# Patient Record
Sex: Male | Born: 1975 | Race: Black or African American | Hispanic: No | Marital: Married | Smoking: Current every day smoker
Health system: Southern US, Community
[De-identification: ages and names within clinical notes are randomized; demographics above are authoritative.]

---

## 2019-01-11 ENCOUNTER — Other Ambulatory Visit: Payer: Self-pay

## 2019-01-11 ENCOUNTER — Inpatient Hospital Stay (HOSPITAL_COMMUNITY)
Admission: EM | Admit: 2019-01-11 | Discharge: 2019-01-14 | DRG: 871 | Disposition: A | Discharge status: Home / Self Care | Payer: Self-pay | Attending: Internal Medicine | Admitting: Internal Medicine

## 2019-01-11 ENCOUNTER — Encounter (HOSPITAL_COMMUNITY): Payer: Self-pay

## 2019-01-11 ENCOUNTER — Emergency Department (HOSPITAL_COMMUNITY): Payer: Self-pay

## 2019-01-11 DIAGNOSIS — Z720 Tobacco use: Secondary | ICD-10-CM

## 2019-01-11 DIAGNOSIS — B9789 Other viral agents as the cause of diseases classified elsewhere: Secondary | ICD-10-CM | POA: Diagnosis present

## 2019-01-11 DIAGNOSIS — J189 Pneumonia, unspecified organism: Secondary | ICD-10-CM | POA: Diagnosis present

## 2019-01-11 DIAGNOSIS — J1289 Other viral pneumonia: Secondary | ICD-10-CM | POA: Diagnosis present

## 2019-01-11 DIAGNOSIS — K625 Hemorrhage of anus and rectum: Secondary | ICD-10-CM | POA: Diagnosis present

## 2019-01-11 DIAGNOSIS — R109 Unspecified abdominal pain: Secondary | ICD-10-CM

## 2019-01-11 DIAGNOSIS — A419 Sepsis, unspecified organism: Principal | ICD-10-CM | POA: Diagnosis present

## 2019-01-11 DIAGNOSIS — Z20828 Contact with and (suspected) exposure to other viral communicable diseases: Secondary | ICD-10-CM | POA: Diagnosis present

## 2019-01-11 DIAGNOSIS — R55 Syncope and collapse: Secondary | ICD-10-CM | POA: Diagnosis present

## 2019-01-11 DIAGNOSIS — Z23 Encounter for immunization: Secondary | ICD-10-CM

## 2019-01-11 DIAGNOSIS — J15212 Pneumonia due to Methicillin resistant Staphylococcus aureus: Secondary | ICD-10-CM | POA: Diagnosis present

## 2019-01-11 LAB — LACTIC ACID, PLASMA
Lactic Acid, Venous: 1.5 mmol/L (ref 0.5–1.9)
Lactic Acid, Venous: 0.9 mmol/L (ref 0.5–1.9)

## 2019-01-11 LAB — CBC
HCT: 37.5 % — ABNORMAL LOW (ref 39.0–52.0)
Hemoglobin: 12.2 g/dL — ABNORMAL LOW (ref 13.0–17.0)
MCH: 32.8 pg (ref 26.0–34.0)
MCHC: 32.5 g/dL (ref 30.0–36.0)
MCV: 100.8 fL — ABNORMAL HIGH (ref 80.0–100.0)
Platelets: 250 10*3/uL (ref 150–400)
RBC: 3.72 MIL/uL — ABNORMAL LOW (ref 4.22–5.81)
RDW: 11.6 % (ref 11.5–15.5)
WBC: 14.4 10*3/uL — ABNORMAL HIGH (ref 4.0–10.5)
nRBC: 0 % (ref 0.0–0.2)

## 2019-01-11 LAB — POC OCCULT BLOOD, ED: Fecal Occult Bld: NEGATIVE

## 2019-01-11 LAB — BASIC METABOLIC PANEL
Anion gap: 11 (ref 5–15)
BUN: 12 mg/dL (ref 6–20)
CO2: 24 mmol/L (ref 22–32)
Calcium: 9.1 mg/dL (ref 8.9–10.3)
Chloride: 103 mmol/L (ref 98–111)
Creatinine, Ser: 1.1 mg/dL (ref 0.61–1.24)
GFR calc Af Amer: >60 mL/min (ref >60)
GFR calc non Af Amer: >60 mL/min (ref >60)
Glucose, Bld: 99 mg/dL (ref 70–99)
Potassium: 3.8 mmol/L (ref 3.5–5.1)
Sodium: 138 mmol/L (ref 135–145)

## 2019-01-11 LAB — URINALYSIS, ROUTINE W REFLEX MICROSCOPIC
Bacteria, UA: NONE SEEN
Bilirubin Urine: NEGATIVE
Glucose, UA: NEGATIVE mg/dL
Ketones, ur: 20 mg/dL — AB
Leukocytes,Ua: NEGATIVE
Nitrite: NEGATIVE
Protein, ur: NEGATIVE mg/dL
Specific Gravity, Urine: 1.029 (ref 1.005–1.030)
pH: 5 (ref 5.0–8.0)

## 2019-01-11 LAB — TYPE AND SCREEN
ABO/RH(D): O POS
Antibody Screen: NEGATIVE

## 2019-01-11 LAB — HEPATIC FUNCTION PANEL
ALT: 19 U/L (ref 0–44)
AST: 20 U/L (ref 15–41)
Albumin: 4.2 g/dL (ref 3.5–5.0)
Alkaline Phosphatase: 48 U/L (ref 38–126)
Bilirubin, Direct: 0.1 mg/dL (ref 0.0–0.2)
Indirect Bilirubin: 1.3 mg/dL — ABNORMAL HIGH (ref 0.3–0.9)
Total Bilirubin: 1.4 mg/dL — ABNORMAL HIGH (ref 0.3–1.2)
Total Protein: 7.2 g/dL (ref 6.5–8.1)

## 2019-01-11 LAB — PROTIME-INR
INR: 1.2 (ref 0.8–1.2)
Prothrombin Time: 14.9 seconds (ref 11.4–15.2)

## 2019-01-11 LAB — LIPASE, BLOOD: Lipase: 17 U/L (ref 11–51)

## 2019-01-11 LAB — SARS CORONAVIRUS 2 BY RT PCR (HOSPITAL ORDER, PERFORMED IN ~~LOC~~ HOSPITAL LAB): SARS Coronavirus 2: NEGATIVE

## 2019-01-11 LAB — APTT: aPTT: 33 seconds (ref 24–36)

## 2019-01-11 MED ORDER — MORPHINE SULFATE (PF) 4 MG/ML IV SOLN
4.0000 mg | Freq: Once | INTRAVENOUS | Status: AC
  Administered 2019-01-11: 21:00:00 4 mg via INTRAVENOUS
  Filled 2019-01-11: qty 1

## 2019-01-11 MED ORDER — SODIUM CHLORIDE 0.9 % IV BOLUS (SEPSIS)
1000.0000 mL | Freq: Once | INTRAVENOUS | Status: AC
  Administered 2019-01-11: 21:00:00 1000 mL via INTRAVENOUS

## 2019-01-11 MED ORDER — SODIUM CHLORIDE 0.9 % IV SOLN
500.0000 mg | Freq: Once | INTRAVENOUS | Status: DC
  Filled 2019-01-11: qty 500

## 2019-01-11 MED ORDER — VANCOMYCIN HCL IN DEXTROSE 750-5 MG/150ML-% IV SOLN
750.0000 mg | Freq: Three times a day (TID) | INTRAVENOUS | Status: DC
  Administered 2019-01-12 – 2019-01-14 (×8): 750 mg via INTRAVENOUS
  Filled 2019-01-11 (×11): qty 150

## 2019-01-11 MED ORDER — SODIUM CHLORIDE 0.9 % IV SOLN
2.0000 g | Freq: Once | INTRAVENOUS | Status: AC
  Administered 2019-01-11: 2 g via INTRAVENOUS
  Filled 2019-01-11: qty 20

## 2019-01-11 MED ORDER — SODIUM CHLORIDE 0.9 % IV SOLN
2.0000 g | Freq: Three times a day (TID) | INTRAVENOUS | Status: DC
  Administered 2019-01-12 – 2019-01-13 (×6): 2 g via INTRAVENOUS
  Filled 2019-01-11 (×8): qty 2

## 2019-01-11 MED ORDER — IOHEXOL 350 MG/ML SOLN
100.0000 mL | Freq: Once | INTRAVENOUS | Status: AC | PRN
  Administered 2019-01-11: 21:00:00 100 mL via INTRAVENOUS

## 2019-01-11 MED ORDER — SODIUM CHLORIDE 0.9% FLUSH
3.0000 mL | Freq: Once | INTRAVENOUS | Status: AC
  Administered 2019-01-11: 20:00:00 3 mL via INTRAVENOUS

## 2019-01-11 MED ORDER — ONDANSETRON HCL 4 MG/2ML IJ SOLN
4.0000 mg | Freq: Once | INTRAMUSCULAR | Status: AC
  Administered 2019-01-11: 21:00:00 4 mg via INTRAVENOUS
  Filled 2019-01-11: qty 2

## 2019-01-11 MED ORDER — SODIUM CHLORIDE 0.9 % IV SOLN
100.0000 mg | Freq: Once | INTRAVENOUS | Status: AC
  Administered 2019-01-11: 100 mg via INTRAVENOUS
  Filled 2019-01-11: qty 100

## 2019-01-11 MED ORDER — METRONIDAZOLE IN NACL 5-0.79 MG/ML-% IV SOLN
500.0000 mg | Freq: Three times a day (TID) | INTRAVENOUS | Status: DC
  Administered 2019-01-12 – 2019-01-13 (×5): 500 mg via INTRAVENOUS
  Filled 2019-01-11 (×5): qty 100

## 2019-01-11 MED ORDER — SODIUM CHLORIDE 0.9 % IV BOLUS
1000.0000 mL | Freq: Once | INTRAVENOUS | Status: AC
  Administered 2019-01-11: 20:00:00 1000 mL via INTRAVENOUS

## 2019-01-11 MED ORDER — METRONIDAZOLE IN NACL 5-0.79 MG/ML-% IV SOLN
500.0000 mg | Freq: Once | INTRAVENOUS | Status: AC
  Administered 2019-01-11: 22:00:00 500 mg via INTRAVENOUS
  Filled 2019-01-11: qty 100

## 2019-01-11 MED ORDER — SODIUM CHLORIDE 0.9 % IV BOLUS (SEPSIS)
500.0000 mL | Freq: Once | INTRAVENOUS | Status: AC
  Administered 2019-01-11: 22:00:00 500 mL via INTRAVENOUS

## 2019-01-11 MED ORDER — ACETAMINOPHEN 500 MG PO TABS
1000.0000 mg | ORAL_TABLET | Freq: Once | ORAL | Status: AC
  Administered 2019-01-11: 21:00:00 1000 mg via ORAL
  Filled 2019-01-11: qty 2

## 2019-01-11 NOTE — Progress Notes (Signed)
Pharmacy Antibiotic Note  Antonio Rowland is a 43 y.o. male admitted on 01/11/2019 with sepsis  Plan: Vanc 750 mg q8h - AUC 511 Cefepime 2 g q8h Metronidazole 500 mg q8h Monitor renal fx cx vanc lvls prn  Height: 5\' 10"  (177.8 cm) Weight: 170 lb (77.1 kg) IBW/kg (Calculated) : 73  Temp (24hrs), Avg:101 F (38.3 C), Min:100.9 F (38.3 C), Max:101 F (38.3 C)  Recent Labs  Lab 01/11/19 1926 01/11/19 2020  WBC 14.4*  --   CREATININE 1.10  --   LATICACIDVEN  --  1.5    Estimated Creatinine Clearance: 90.3 mL/min (by C-G formula based on SCr of 1.1 mg/dL).    Allergies  Allergen Reactions  . Azithromycin     hives  . Norco [Hydrocodone-Acetaminophen]     Weakness  . Ritalin [Methylphenidate]     swelling   Levester Fresh, PharmD, BCPS, BCCCP Clinical Pharmacist [phone]  Please check AMION for all [place] numbers  01/11/2019 11:13 PM

## 2019-01-11 NOTE — H&P (Signed)
Antonio Rowland QIO:[id number] DOB: 30-Dec-1975 DOA: 01/11/2019     PCP:at [facility]:  NONE    Patient arrived to ER on 01/11/19 at [place]  Patient coming from: home Lives   With family    Chief Complaint:   Chief Complaint  Patient presents with   Loss of Consciousness    HPI: Antonio Rowland is a 43 y.o. male with no significant medical history      Presented with bloody bowel movement today an episode of vomiting followed by syncopal episode has been having generalized abdominal pain. No preceding chest pain No prior hx of the same He was unsure he had a fever  He does not drink,  Recently started to smoke   Infectious risk factors:  Reports  fever,  abdominal pain,  Body aches, severe fatigue sick contacts, known COVID 19 exposure, Travel to high risk area, Exposure to travelers from high risk area   In  ER RAPID COVID TEST NEGATIVE   Lab Results  Component Value Date   [place] 01/11/2019        While in ER: Noted to be tachycardic up to 103 febrile 100.9 elevated white blood cell count to 14.4 hemoglobin 12.2 Started on IV fluids and given Rocephin and Flagyl for intra-abdominal causes of sepsis given tachycardia white blood cell count elevation  No melena bright red blood per rectum noted Lactic acid 1.5 hemoglobin stable  The following Work up has been ordered so far:  Orders Placed This Encounter  Procedures   Culture, blood (routine x 2)   SARS Coronavirus 2 (Hospital order, Performed in [facility] hospital lab) Nasopharyngeal Nasopharyngeal Swab   DG Chest Port 1 View   DG Abd Portable 1 View   CT Angio Abd/Pel W and/or Wo Contrast   Basic metabolic panel   CBC   Lipase, blood   Urinalysis, Routine w reflex microscopic   Lactic acid, plasma   Hepatic function panel   Protime-INR   APTT   Diet NPO time specified   Cardiac monitoring   Saline Lock IV, Maintain IV access   Cardiac  monitoring   Refer to Sidebar Report: Sepsis Sidebar ED/IP   Document vital signs within 1-hour of fluid bolus completion and notify provider of bolus completion   Document Actual / Estimated Weight   Insert peripheral IV x 2   Initiate Carrier Fluid Protocol   Initiate Code Sepsis ([facility] [phone]) Reason for Consult? tracking   pharmacy consult   Consult to general surgery  ALL PATIENTS BEING ADMITTED/HAVING PROCEDURES NEED COVID-19 SCREENING   Consult for Unassigned Medical Admission  ALL PATIENTS BEING ADMITTED/HAVING PROCEDURES NEED COVID-19 SCREENING   Airborne and Contact precautions   Pulse oximetry, continuous   Pulse oximetry, continuous   POC occult blood, ED   ED EKG   Type and screen [place]   ABO/Rh     Following Medications were ordered in ER: Medications  metroNIDAZOLE (FLAGYL) IVPB 500 mg (500 mg Intravenous New Bag/Given 01/11/19 2144)  azithromycin (ZITHROMAX) 500 mg in sodium chloride 0.9 % 250 mL IVPB (has no administration in time range)  sodium chloride flush (NS) 0.9 % injection 3 mL (3 mLs Intravenous Given 01/11/19 2025)  sodium chloride 0.9 % bolus 1,000 mL (1,000 mLs Intravenous New Bag/Given 01/11/19 2025)  ondansetron (ZOFRAN) injection 4 mg (4 mg Intravenous Given 01/11/19 2039)  morphine 4 MG/ML injection 4 mg (4 mg Intravenous Given 01/11/19 2042)  acetaminophen (TYLENOL) tablet  1,000 mg (1,000 mg Oral Given 01/11/19 2041)  sodium chloride 0.9 % bolus 1,000 mL (0 mLs Intravenous Stopped 01/11/19 2144)    And  sodium chloride 0.9 % bolus 500 mL (500 mLs Intravenous New Bag/Given 01/11/19 2145)  cefTRIAXone (ROCEPHIN) 2 g in sodium chloride 0.9 % 100 mL IVPB (0 g Intravenous Stopped 01/11/19 2220)  iohexol (OMNIPAQUE) 350 MG/ML injection 100 mL (100 mLs Intravenous Contrast Given 01/11/19 2123)      ER Provider Called: General Surgery    Dr. Cherlyn Roberts  They Recommend admit to medicine   Will see    in ER     Significant initial  Findings: Abnormal Labs Reviewed  CBC - Abnormal; Notable for the following components:      Result Value   WBC 14.4 (*)    RBC 3.72 (*)    Hemoglobin 12.2 (*)    HCT 37.5 (*)    MCV 100.8 (*)    All other components within normal limits  HEPATIC FUNCTION PANEL - Abnormal; Notable for the following components:   Total Bilirubin 1.4 (*)    Indirect Bilirubin 1.3 (*)    All other components within normal limits     Otherwise labs showing:    Recent Labs  Lab 01/11/19 1926  NA 138  K 3.8  CO2 24  GLUCOSE 99  BUN 12  CREATININE 1.10  CALCIUM 9.1    Cr   stable,    Lab Results  Component Value Date   CREATININE 1.10 01/11/2019    Recent Labs  Lab 01/11/19 2055  AST 20  ALT 19  ALKPHOS 48  BILITOT 1.4*  PROT 7.2  ALBUMIN 4.2   Lab Results  Component Value Date   CALCIUM 9.1 01/11/2019       WBC       Component Value Date/Time   WBC 14.4 (H) 01/11/2019 1926     Plt: Lab Results  Component Value Date   PLT 250 01/11/2019     Lactic Acid, Venous    Component Value Date/Time   LATICACIDVEN 1.5 01/11/2019 2020    Procalcitonin  Ordered   COVID-19 Labs  No results for input(s): DDIMER, FERRITIN, LDH, CRP in the last 72 hours.  Lab Results  Component Value Date   SARSCOV2NAA NEGATIVE 01/11/2019    HG/HCT     Component Value Date/Time   HGB 12.2 (L) 01/11/2019 1926   HCT 37.5 (L) 01/11/2019 1926    Recent Labs  Lab 01/11/19 1926  LIPASE 17      Troponin  ordered     ECG: Ordered Personally reviewed by me showing: HR : 110 Rhythm:  Sinus tachycardia     no evidence of ischemic changes QTC 443       UA  not ordered   Urine analysis:    Component Value Date/Time   COLORURINE YELLOW 01/11/2019 2021   APPEARANCEUR CLEAR 01/11/2019 2021   LABSPEC 1.029 01/11/2019 2021   [place] 5.0 01/11/2019 2021   GLUCOSEU NEGATIVE 01/11/2019 2021   HGBUR SMALL (A) 01/11/2019 2021   [place] NEGATIVE 01/11/2019  2021   KETONESUR 20 (A) 01/11/2019 2021   PROTEINUR NEGATIVE 01/11/2019 2021   NITRITE NEGATIVE 01/11/2019 2021   LEUKOCYTESUR NEGATIVE 01/11/2019 2021    Ordered    CXR - Patchy airspace opacity seen at the left lung base.   CTabd/pelvis -  abnormal non specific findings       ED Triage Vitals  Enc Vitals Group  BP 01/11/19 1905 122/72     Pulse Rate 01/11/19 1905 (!) 103     Resp 01/11/19 1905 18     Temp 01/11/19 1905 (!) 100.9 F (38.3 C)     Temp Source 01/11/19 1905 Oral     SpO2 01/11/19 1905 97 %     Weight 01/11/19 2025 170 lb (77.1 kg)     Height 01/11/19 2025 5' 10"  (1.778 m)     Head Circumference --      Peak Flow --      Pain Score 01/11/19 1904 10     Pain Loc --      Pain Edu? --      Excl. in Accident? --   TMAX(24)@       Latest  Blood pressure 132/63, pulse (!) 108, temperature (!) 101 F (38.3 C), temperature source Oral, resp. rate 16, height 5' 10"  (1.778 m), weight 77.1 kg, SpO2 95 %.    Hospitalist was called for admission for CAP, syncope   Review of Systems:    Pertinent positives include: fatigue, bdominal pain, nausea  diarrhea Constitutional:  No weight loss, night sweats, Fevers, chills, weight loss  HEENT:  No headaches, Difficulty swallowing,Tooth/dental problems,Sore throat,  No sneezing, itching, ear ache, nasal congestion, post nasal drip,  Cardio-vascular:  No chest pain, Orthopnea, PND, anasarca, dizziness, palpitations.no Bilateral lower extremity swelling  GI:  No heartburn, indigestion, a, vomiting,, change in bowel habits, loss of appetite, melena, blood in stool, hematemesis Resp:  no shortness of breath at rest. No dyspnea on exertion, No excess mucus, no productive cough, No non-productive cough, No coughing up of blood.No change in color of mucus.No wheezing. Skin:  no rash or lesions. No jaundice GU:  no dysuria, change in color of urine, no urgency or frequency. No straining to urinate.  No flank pain.    Musculoskeletal:  No joint pain or no joint swelling. No decreased range of motion. No back pain.  Psych:  No change in mood or affect. No depression or anxiety. No memory loss.  Neuro: no localizing neurological complaints, no tingling, no weakness, no double vision, no gait abnormality, no slurred speech, no confusion  All systems reviewed and apart from [place] all are negative  Past Medical History:  History reviewed. No pertinent past medical history.    History reviewed. No pertinent surgical history.  Social History:  Ambulatory   independently       reports that he has been smoking. He has never used smokeless tobacco. No history on file for alcohol and drug.     Family History:   Family History  Problem Relation Age of Onset   Diabetes Other    Hypertension Other     Allergies: Allergies  Allergen Reactions   Norco [Hydrocodone-Acetaminophen]     Weakness   Ritalin [Methylphenidate]     swelling     Prior to Admission medications   Medication Sig Start Date End Date Taking? Authorizing Provider  ibuprofen (ADVIL) 200 MG tablet Take 200 mg by mouth every 6 (six) hours as needed for moderate pain.   Yes [provider]   Physical Exam: Blood pressure 132/63, pulse (!) 108, temperature (!) 101 F (38.3 C), temperature source Oral, resp. rate 16, height 5' 10"  (1.778 m), weight 77.1 kg, SpO2 95 %. 1. General:  in No Acute distress    Chronically ill  -appearing 2. Psychological: Alert and  Oriented 3. Head/ENT:     Dry Mucous Membranes  Head Non traumatic, neck supple                           Poor Dentition 4. SKIN:  decreased Skin turgor,  Skin clean Dry and intact no rash 5. Heart: Regular rate and rhythm no Murmur, no Rub or gallop 6. Lungs:   no wheezes or crackles   7. Abdomen:  Tender to mild palpation, Non distended  bowel sounds present 8. Lower extremities: no clubbing, cyanosis, no  edema 9. Neurologically  Grossly intact, moving all 4 extremities equally  10. MSK: Normal range of motion   All other LABS:     Recent Labs  Lab 01/11/19 1926  WBC 14.4*  HGB 12.2*  HCT 37.5*  MCV 100.8*  PLT 250     Recent Labs  Lab 01/11/19 1926  NA 138  K 3.8  CL 103  CO2 24  GLUCOSE 99  BUN 12  CREATININE 1.10  CALCIUM 9.1     Recent Labs  Lab 01/11/19 2055  AST 20  ALT 19  ALKPHOS 48  BILITOT 1.4*  PROT 7.2  ALBUMIN 4.2       Cultures: No results found for: SDES, SPECREQUEST, CULT, REPTSTATUS   Radiological Exams on Admission: Dg Chest Port 1 View  Result Date: 01/11/2019 CLINICAL DATA:  Fever, left-sided chest pain EXAM: PORTABLE CHEST 1 VIEW COMPARISON:  None. FINDINGS: The heart size and mediastinal contours are within normal limits. Patchy airspace opacity seen at the left lung base. The visualized skeletal structures are unremarkable. IMPRESSION: Patchy airspace opacity seen at the left lung base. The findings in the lungs are nonspecific, but concerning for atypical infection, which includes viral pneumonia. Electronically Signed   By: Prudencio Pair M.D.   On: 01/11/2019 22:08   Dg Abd Portable 1 View  Addendum Date: 01/11/2019   ADDENDUM REPORT: 01/11/2019 21:51 ADDENDUM: Hazy airspace disease at the left lung base consistent with pneumonia. Electronically Signed   By: Kathreen Devoid   On: 01/11/2019 21:51   Result Date: 01/11/2019 CLINICAL DATA:  Evaluate for free air, perforation EXAM: PORTABLE ABDOMEN - 1 VIEW COMPARISON:  None. FINDINGS: The bowel gas pattern is normal. No pneumoperitoneum. No radio-opaque calculi or other significant radiographic abnormality are seen. IMPRESSION: No pneumoperitoneum. Electronically Signed: By: Kathreen Devoid On: 01/11/2019 21:33   Ct Angio Abd/pel W And/or Wo Contrast  Result Date: 01/11/2019 CLINICAL DATA:  GI bleed, covid sepsis left lower quadrant abdominal pain to . EXAM: CTA ABDOMEN AND PELVIS wITHOUT AND WITH CONTRAST TECHNIQUE:  Multidetector CT imaging of the abdomen and pelvis was performed using the standard protocol during bolus administration of intravenous contrast. Multiplanar reconstructed images and MIPs were obtained and reviewed to evaluate the vascular anatomy. CONTRAST:  164m OMNIPAQUE IOHEXOL 350 MG/ML SOLN COMPARISON:  None. FINDINGS: CTA ABDOMEN AND PELVIS FINDINGS VASCULAR Aorta: Normal caliber aorta without aneurysm, dissection, vasculitis or hemodynamically significant stenosis. There is mild aortic atherosclerosis. Celiac: No aneurysm, dissection or hemodynamically significant stenosis. Normal branching pattern SMA: Widely patent without dissection or stenosis. Renals: Single renal arteries bilaterally. No aneurysm, dissection, stenosis or evidence of fibromuscular dysplasia. IMA: Patent without abnormality. Inflow: No aneurysm, stenosis or dissection. Veins: Normal course and caliber of the major veins. Assessment is otherwise limited by the arterial dominant contrast phase. Review of the MIP images confirms the above findings. Review of the MIP images confirms the above findings. NON-VASCULAR Lower chest: There is patchy airspace opacity  seen throughout the left base. Hepatobiliary: No solid liver abnormality is seen. No gallstones, gallbladder wall thickening, or biliary dilatation. Pancreas: Unremarkable. No pancreatic ductal dilatation or surrounding inflammatory changes. Spleen: Normal in size without significant abnormality. Adrenals/Urinary Tract: Adrenal glands are unremarkable. Kidneys are normal, without renal calculi, solid lesion, or hydronephrosis. Bladder is unremarkable. Stomach/Bowel: The stomach appears to be grossly unremarkable. There small air bubbles seen within small bowel in the left mid abdomen. No definite bowel wall thickening or surrounding free air is seen. Colonic diverticula noted without definite diverticulitis. There is question Lymphatic: No enlarged abdominal or pelvic lymph nodes.  Reproductive: No mass or other significant abnormality. Other: No abdominal wall hernia or abnormality. No abdominopelvic ascites. Musculoskeletal: No acute or significant osseous findings. Review of the MIP images confirms the above findings IMPRESSION: VASCULAR 1. No acute vascular abnormality. 2. NON-VASCULAR 1. Patchy airspace opacity seen throughout the left lung base. This could be due to atypical viral infectious etiology. 2. Small air bubbles seen within the left mid abdomen likely within small bowel loops. No definite bowel wall thickening or surrounding inflammatory changes. Not thought to likely represent pneumatosis. However if further evaluation is warranted would recommend repeat CT with oral contrast. 3. Diverticulosis without definite diverticulitis. These results were called by telephone at the time of interpretation on 01/11/2019 at 10:01 pm to provider [facility], [facility], who verbally acknowledged these results. Electronically Signed   By: Prudencio Pair M.D.   On: 01/11/2019 22:04    Chart has been reviewed    Assessment/Plan   43 y.o. male with no significant medical history   Admitted for sepsis due to PNA  Present on Admission:  Sepsis ([place]) -  -SIRS criteria met with  elevated white blood cell count,  tachycardia ,   fever.     without evidence of end organ damage  -Most likely source being:  Pulmonary vs intra-abdominal,   - Obtain serial lactic acid and procalcitonin level.  - Initiate IV antibiotics   - await results of blood and urine culture  - Rehydrate aggressively    Bright red blood per rectum - one episode, continue to monitor, hemoccult negative No hx of PUD   CAP (community acquired pneumonia) -  - will admit for treatment of CAP will start on appropriate antibiotic coverage. COVID negative   Obtain:  sputum cultures,                 Obtain respiratory panel and influenza serologies                 blood cultures if febrile or if decompensates.                    strep pneumo UA antigen,                   given diarrhea also check for Legionella.                Provide oxygen as needed.                 May benefit from repeat imaging of the chest to further qualify PNA   Given atypical presentation if D.dimer significant elevated may benefit from PE work up    Tobacco abuse -   Spoke about importance of quitting spent 5 minutes discussing options for treatment, prior attempts at quitting, and dangers of smoking  -At this point patient is   interested in quitting  -  order nicotine patch   - nursing tobacco cessation protocol  CT abdomen abnormal - Small Air bubbles seen within the left mid abdomen likely within small bowel loops.   Not thought to likely represent pneumatosis. General surgery has been consulted  At this point will follow but no indication for surgical intervention  repeat CTwith oral contrast if pain persists or worsens  Syncope - the setting of having bright red blood per-rectum Initially tachy cardiac, obtain troponin, ECG Check echo No evidence of hypotension at this time   Other plan as per orders.  DVT prophylaxis:  SCD     Code Status:  FULL CODE   Family Communication:   Family not at  Bedside   Disposition Plan:     To home once workup is complete and patient is stable                       Consults called: general Surgery  Admission status:  ED Disposition    None         inpatient     Expect 2 midnight stay secondary to severity of patient's current illness including   hemodynamic instability despite optimal treatment (tachycardia tachypnea  )  Severe lab/radiological/exam abnormalities including:  PNEumonia     That are currently affecting medical management.   I expect  patient to be hospitalized for 2 midnights requiring inpatient medical care.  Patient is at high risk for adverse outcome (such as loss of life or disability) if not treated.  Indication for inpatient stay as follows:      Hemodynamic instability despite maximal medical therapy,    severe pain requiring acute inpatient management,  inability to maintain oral hydration   persistent chest pain despite medical management    Need for IV antibiotics, IV fluids,   IV pain medications      Level of care   SDU tele indefinitely please discontinue once patient no longer qualifies  Precautions:   No active isolations  PPE: Used by the provider:   P100  eye Goggles,  Gloves    Francyne Arreaga 01/12/2019, 1:28 AM    [facility]     after 2 AM please page floor coverage PA If 7AM-7PM, please contact the day team taking care of the patient using [email or web address]

## 2019-01-11 NOTE — ED Provider Notes (Signed)
[facility] EMERGENCY DEPARTMENT Provider Note   CSN: [id number] Arrival date & time: 01/11/19  1857     History   Chief Complaint Chief Complaint  Patient presents with   Loss of Consciousness    HPI Antonio Rowland is a 43 y.o. male.     Antonio Rowland is a 43 y.o. male who is otherwise healthy, presents via EMS for evaluation of left lower quadrant abdominal pain, vomiting and one episode of bright red blood per rectum.  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  He then felt like he needed to have a bowel movement suddenly, ran to the porta potty on the job site and passed bright red blood, then had a syncopal episode, but did not hit his head or sustain any injuries.  He reports since then he has had worsening pain with vomiting and so EMS was called to job site.  He reports chills, has not checked his temperature but feels feverish.  He has had some intermittent coughing since symptoms started as well but denies chest pain or shortness of breath.  He denies any urinary symptoms.  He denies any known sick contacts and reports he is tested every other day for COVID at his work, test results from yesterday were negative.  He does wear a mask at work.  He denies any other health problems.  No medications prior to arrival.  Does use NSAIDs intermittently for foot pain from standing at work.  No prior history of GI bleeding.     History reviewed. No pertinent past medical history.  There are no active problems to display for this patient.   History reviewed. No pertinent surgical history.      Home Medications    Prior to Admission medications   Not on File    Family History No family history on file.  Social History Social History   Tobacco Use   Smoking status: Not on file  Substance Use Topics   Alcohol use: Not on file   Drug use: Not on file      Allergies   Patient has no known allergies.   Review of Systems Review of Systems  Constitutional: Positive for chills, diaphoresis and fever.  HENT: Negative.   Eyes: Negative for visual disturbance.  Respiratory: Negative for cough and shortness of breath.   Cardiovascular: Negative for chest pain.  Gastrointestinal: Positive for abdominal pain, blood in stool, nausea and vomiting.  Genitourinary: Positive for flank pain. Negative for dysuria and frequency.  Musculoskeletal: Negative for arthralgias and myalgias.  Skin: Negative for color change and rash.  Neurological: Positive for syncope and light-headedness. Negative for dizziness and headaches.     Physical Exam Updated Rowland Signs BP 122/72 (BP Location: Right Arm)    Pulse (!) 103    Temp (!) 100.9 F (38.3 C) (Oral)    Resp 18    SpO2 97%   Physical Exam Vitals signs and nursing note reviewed.  Constitutional:      General: He is not in acute distress.    Appearance: Normal appearance. He is well-developed and normal weight. He is ill-appearing and diaphoretic.  HENT:     Head: Normocephalic and atraumatic.     Mouth/Throat:     Mouth: Mucous membranes are moist.     Pharynx: Oropharynx is clear.  Eyes:     General:  Right eye: No discharge.        Left eye: No discharge.     Pupils: Pupils are equal, round, and reactive to light.  Neck:     Musculoskeletal: Neck supple.  Cardiovascular:     Rate and Rhythm: Regular rhythm. Tachycardia present.     Heart sounds: Normal heart sounds. No murmur. No friction rub. No gallop.   Pulmonary:     Effort: Pulmonary effort is normal. No respiratory distress.     Breath sounds: Normal breath sounds. No wheezing or rales.     Comments: Respirations equal and unlabored, patient able to speak in full sentences, lungs clear to auscultation bilaterally, occasional coughing during exam Abdominal:     General: Bowel sounds are normal. There is no distension.      Palpations: Abdomen is soft. There is no mass.     Tenderness: There is abdominal tenderness. There is guarding.     Comments: Abdomen rigid with significant tenderness to palpation on the left side of the abdomen, most focal in the left lower quadrant with guarding present, bowel sounds are present throughout.  Musculoskeletal:        General: No deformity.     Right lower leg: No edema.     Left lower leg: No edema.  Skin:    General: Skin is warm.     Capillary Refill: Capillary refill takes less than 2 seconds.  Neurological:     Mental Status: He is alert.     Coordination: Coordination normal.     Comments: Speech is clear, able to follow commands Moves extremities without ataxia, coordination intact  Psychiatric:        Mood and Affect: Mood normal.        Behavior: Behavior normal.      ED Treatments / Results  Labs (all labs ordered are listed, but only abnormal results are displayed) Labs Reviewed  CBC - Abnormal; Notable for the following components:      Result Value   WBC 14.4 (*)    RBC 3.72 (*)    Hemoglobin 12.2 (*)    HCT 37.5 (*)    MCV 100.8 (*)    All other components within normal limits  URINALYSIS, ROUTINE W REFLEX MICROSCOPIC - Abnormal; Notable for the following components:   Hgb urine dipstick SMALL (*)    Ketones, ur 20 (*)    All other components within normal limits  HEPATIC FUNCTION PANEL - Abnormal; Notable for the following components:   Total Bilirubin 1.4 (*)    Indirect Bilirubin 1.3 (*)    All other components within normal limits  SARS CORONAVIRUS 2 (HOSPITAL ORDER, PERFORMED IN [place] [facility])  CULTURE, BLOOD (ROUTINE X 2)  CULTURE, BLOOD (ROUTINE X 2)  RESPIRATORY PANEL BY PCR  BASIC METABOLIC PANEL  LIPASE, BLOOD  LACTIC ACID, PLASMA  LACTIC ACID, PLASMA  PROTIME-INR  APTT  LACTIC ACID, PLASMA  LACTIC ACID, PLASMA  PROCALCITONIN  POC OCCULT BLOOD, ED  TYPE AND SCREEN  ABO/RH    EKG EKG  Interpretation  Date/Time:  Wednesday January 11 2019 19:14:24 EDT Ventricular Rate:  110 PR Interval:  156 QRS Duration: 74 QT Interval:  328 QTC Calculation: 443 R Axis:   108 Text Interpretation:  Sinus tachycardia Rightward axis Possible Anterior infarct , age undetermined Abnormal ECG No significant change since last tracing Confirmed by Richardean Canal [id number]) on 01/11/2019 8:06:26 PM   Radiology Dg Abd Portable 1 View  Addendum  Date: 01/11/2019   ADDENDUM REPORT: 01/11/2019 21:51 ADDENDUM: Hazy airspace disease at the left lung base consistent with pneumonia. Electronically Signed   By: Elige Ko   On: 01/11/2019 21:51   Result Date: 01/11/2019 CLINICAL DATA:  Evaluate for free air, perforation EXAM: PORTABLE ABDOMEN - 1 VIEW COMPARISON:  None. FINDINGS: The bowel gas pattern is normal. No pneumoperitoneum. No radio-opaque calculi or other significant radiographic abnormality are seen. IMPRESSION: No pneumoperitoneum. Electronically Signed: By: Elige Ko On: 01/11/2019 21:33   Ct Angio Abd/pel W And/or Wo Contrast  Result Date: 01/11/2019 CLINICAL DATA:  GI bleed, covid sepsis left lower quadrant abdominal pain to . EXAM: CTA ABDOMEN AND PELVIS wITHOUT AND WITH CONTRAST TECHNIQUE: Multidetector CT imaging of the abdomen and pelvis was performed using the standard protocol during bolus administration of intravenous contrast. Multiplanar reconstructed images and MIPs were obtained and reviewed to evaluate the vascular anatomy. CONTRAST:  OMNIPAQUE IOHEXOL 350 MG/ML SOLN COMPARISON:  None. FINDINGS: CTA ABDOMEN AND PELVIS FINDINGS VASCULAR Aorta: Normal caliber aorta without aneurysm, dissection, vasculitis or hemodynamically significant stenosis. There is mild aortic atherosclerosis. Celiac: No aneurysm, dissection or hemodynamically significant stenosis. Normal branching pattern SMA: Widely patent without dissection or stenosis. Renals: Single renal arteries bilaterally. No  aneurysm, dissection, stenosis or evidence of fibromuscular dysplasia. IMA: Patent without abnormality. Inflow: No aneurysm, stenosis or dissection. Veins: Normal course and caliber of the major veins. Assessment is otherwise limited by the arterial dominant contrast phase. Review of the MIP images confirms the above findings. Review of the MIP images confirms the above findings. NON-VASCULAR Lower chest: There is patchy airspace opacity seen throughout the left base. Hepatobiliary: No solid liver abnormality is seen. No gallstones, gallbladder wall thickening, or biliary dilatation. Pancreas: Unremarkable. No pancreatic ductal dilatation or surrounding inflammatory changes. Spleen: Normal in size without significant abnormality. Adrenals/Urinary Tract: Adrenal glands are unremarkable. Kidneys are normal, without renal calculi, solid lesion, or hydronephrosis. Bladder is unremarkable. Stomach/Bowel: The stomach appears to be grossly unremarkable. There small air bubbles seen within small bowel in the left mid abdomen. No definite bowel wall thickening or surrounding free air is seen. Colonic diverticula noted without definite diverticulitis. There is question Lymphatic: No enlarged abdominal or pelvic lymph nodes. Reproductive: No mass or other significant abnormality. Other: No abdominal wall hernia or abnormality. No abdominopelvic ascites. Musculoskeletal: No acute or significant osseous findings. Review of the MIP images confirms the above findings IMPRESSION: VASCULAR 1. No acute vascular abnormality. 2. NON-VASCULAR 1. Patchy airspace opacity seen throughout the left lung base. This could be due to atypical viral infectious etiology. 2. Small air bubbles seen within the left mid abdomen likely within small bowel loops. No definite bowel wall thickening or surrounding inflammatory changes. Not thought to likely represent pneumatosis. However if further evaluation is warranted would recommend repeat CT with oral  contrast. 3. Diverticulosis without definite diverticulitis. These results were called by telephone at the time of interpretation on 01/11/2019 at 10:01 pm to provider [facility], who verbally acknowledged these results. Electronically Signed   By: Jonna Clark M.D.   On: 01/11/2019 22:04    Procedures .Critical Care Performed by: Dartha Lodge, PA-C Authorized by: Dartha Lodge, PA-C   Critical care provider statement:    Critical care time (minutes):  45   Critical care was necessary to treat or prevent imminent or life-threatening deterioration of the following conditions:  Sepsis   Critical care was time spent personally by me on the  following activities:  Discussions with consultants, evaluation of patient's response to treatment, examination of patient, ordering and performing treatments and interventions, ordering and review of laboratory studies, ordering and review of radiographic studies, pulse oximetry, re-evaluation of patient's condition, obtaining history from patient or surrogate and review of old charts   (including critical care time)  Medications Ordered in ED Medications  sodium chloride 0.9 % bolus 1,000 mL (1,000 mLs Intravenous New Bag/Given 01/11/19 2100)    And  sodium chloride 0.9 % bolus 500 mL (has no administration in time range)  cefTRIAXone (ROCEPHIN) 2 g in sodium chloride 0.9 % 100 mL IVPB (2 g Intravenous New Bag/Given 01/11/19 2059)  metroNIDAZOLE (FLAGYL) IVPB 500 mg (has no administration in time range)  sodium chloride flush (NS) 0.9 % injection 3 mL (3 mLs Intravenous Given 01/11/19 2025)  sodium chloride 0.9 % bolus 1,000 mL (1,000 mLs Intravenous New Bag/Given 01/11/19 2025)  ondansetron (ZOFRAN) injection 4 mg (4 mg Intravenous Given 01/11/19 2039)  morphine 4 MG/ML injection 4 mg (4 mg Intravenous Given 01/11/19 2042)  acetaminophen (TYLENOL) tablet 1,000 mg (1,000 mg Oral Given 01/11/19 2041)     Initial Impression / Assessment and Plan / ED  Course  I have reviewed the triage Rowland signs and the nursing notes.  Pertinent labs & imaging results that were available during my care of the patient were reviewed by me and considered in my medical decision making (see chart for details).  43 year old male arrives febrile and tachycardic with severe left lower quadrant abdominal pain associated with one episode of bright red blood per rectum and nonbloody emesis, he has guarding on exam concerning for acute abdomen.  Patient is diaphoretic and ill-appearing.  Code sepsis initiated.  Differential includes perforation, mesenteric ischemia, complicated diverticulitis.  Sepsis labs ordered, occult blood collected as well as type and screen.  Covid test collected as well although lower suspicion for this.  Patient having an occasional cough as well will check chest x-ray.  Patient reports he is tested every other day for work and had negative test yesterday.  Patient will be given 30 cc/kg bolus as well as Rocephin and Flagyl for intra-abdominal cause of sepsis.  Pain and nausea medications given as well.  Patient does have NSAID use but given bright red blood, no melena and no hematemesis low suspicion for upper GI bleeding.   Labs so far show normal lactic acid of 1.5, leukocytosis of 14.4, stable hemoglobin of 12.2, no previous available for comparison.  No acute electrolyte derangements.  Hemoccult, type and screen, hepatic function panel and blood cultures pending as well as COVID swab.  Portable chest and 1 view abdomen obtained, no obvious free air on abdominal film.  Chest x-ray consistent with left lower lobe pneumonia.  Called by radiologist regarding CT findings, there is some mild possible air bubbles in the small bowel which could potentially be pneumatosis but radiologist states this is hard to define without having oral contrast since patient is fairly thin.  No other significant abnormalities noted aside from already diagnosed left lower  lobe pneumonia.  Patient continues to have left-sided abdominal pain with guarding, will consult general surgery given possible pneumatosis on exam, lactic acid is reassuring at least.  Case discussed with Dr. Sheliah HatchKinsinger with general surgery who will see patient in consult.  Will plan for hospitalist admission for sepsis with pneumonia.  Case discussed with Dr. Adela Glimpseoutova with [facility] hospitalist who will see and admit the patient.  Patient discussed with  Dr. Darl Householder, who saw patient as well and agrees with plan.  Final Clinical Impressions(s) / ED Diagnoses   Final diagnoses:  Community acquired pneumonia of left lower lobe of lung  Left sided abdominal pain    ED Discharge Orders    None       Jacqlyn Larsen, [place] 01/11/19 2350    Drenda Freeze, MD 01/12/19 [phone]

## 2019-01-11 NOTE — ED Triage Notes (Signed)
Patient states that he had bloody bowel movement today with 1 episode of vomiting and than reports syncopal episode. Patient alert and oriented, complains of ongoing generalized abdominal pain

## 2019-01-11 NOTE — Consult Note (Signed)
Reason for Consult: Abdominal pain Referring Physician: Dr. Chaney Malling  Antonio Rowland is an 43 y.o. male.  HPI:  Antonio Rowland is a previously healthy 43 yo male.  He reports he was working today when he began having sudden onset abdominal pain.  He went to the bathroom, and began having bloody diarrhea and vomiting.  He had a syncopal episode and lost consciousness for 15 minutes.  He now presents with severe LLQ, LUQ, and RLQ abdominal pain.  He reports that the pain is worse with breathing.  He has also had several more episodes of vomiting.    He states that he has never had anything like this before.  He denies any personal history of colitis, and states that he has not had a colonoscopy previously.  He also denies recent illness.  He does endorse daily NSAID use for foot pain, and does smoke tobacco.    ED workup consistent with elevated WBC of 14.4, hemogolobin of 12.2.  CT chest and abdomen reveals patchy airspace opacity in left lung base, small air bubbles in left mid abdomen likely within small bowel, and diverticulosis without definitive evidence of diverticulitis. Fecal occult stool test negative.    History reviewed. No pertinent past medical history.  History reviewed. No pertinent surgical history.  No family history on file.  Social History:  has no history on file for tobacco, alcohol, and drug.  Allergies:  Allergies  Allergen Reactions  . Azithromycin     hives  . Norco [Hydrocodone-Acetaminophen]     Weakness  . Ritalin [Methylphenidate]     swelling    Medications: I have reviewed the patient's current medications.  Results for orders placed or performed during the hospital encounter of 01/11/19 (from the past 48 hour(s))  Basic metabolic panel     Status: None   Collection Time: 01/11/19  7:26 PM  Result Value Ref Range   Sodium 138 135 - 145 mmol/L   Potassium 3.8 3.5 - 5.1 mmol/L   Chloride 103 98 - 111 mmol/L   CO2 24 22 - 32 mmol/L   Glucose,  Bld 99 70 - 99 mg/dL   BUN 12 6 - 20 mg/dL   Creatinine, Ser 1.61 0.61 - 1.24 mg/dL   Calcium 9.1 8.9 - 09.6 mg/dL   GFR calc non Af Amer >60 >60 mL/min   GFR calc Af Amer >60 >60 mL/min   Anion gap 11 5 - 15    Comment: Performed at [facility], [address] [place], [place] [address]  CBC     Status: Abnormal   Collection Time: 01/11/19  7:26 PM  Result Value Ref Range   WBC 14.4 (H) 4.0 - 10.5 K/uL   RBC 3.72 (L) 4.22 - 5.81 MIL/uL   Hemoglobin 12.2 (L) 13.0 - 17.0 g/dL   HCT 98.1 (L) 19.1 - 47.8 %   MCV 100.8 (H) 80.0 - 100.0 fL   MCH 32.8 26.0 - 34.0 pg   MCHC 32.5 30.0 - 36.0 g/dL   RDW 29.5 62.1 - 30.8 %   Platelets 250 150 - 400 K/uL   nRBC 0.0 0.0 - 0.2 %    Comment: Performed at [facility], [address], [place], [place] [address]  Lipase, blood     Status: None   Collection Time: 01/11/19  7:26 PM  Result Value Ref Range   Lipase 17 11 - 51 U/L    Comment: Performed at [facility], [address] [place], [place] [address]  Lactic acid, plasma     Status: None   Collection Time: 01/11/19  8:20 PM  Result Value Ref Range   Lactic Acid, Venous 1.5 0.5 - 1.9 mmol/L    Comment: Performed at [facility], [address] [place], [place] [address]  Type and screen [facility]     Status: None   Collection Time: 01/11/19  8:21 PM  Result Value Ref Range   ABO/RH(D) O POS    Antibody Screen NEG    Sample Expiration      01/14/2019,2359 Performed at [facility], [address] [place], [place] [address]   ABO/Rh     Status: None (Preliminary result)   Collection Time: 01/11/19  8:21 PM  Result Value Ref Range   ABO/RH(D)      O POS Performed at [facility], [address] [place], [place] [address]   SARS Coronavirus 2 [facility](Hospital order, Performed in [facility]) Nasopharyngeal Nasopharyngeal Swab     Status: None   Collection Time: 01/11/19  8:30 PM   Specimen: Nasopharyngeal Swab   Result Value Ref Range   SARS Coronavirus 2 NEGATIVE NEGATIVE    Comment: (NOTE) If result is NEGATIVE SARS-CoV-2 target nucleic acids are NOT DETECTED. The SARS-CoV-2 RNA is generally detectable in upper and lower  respiratory specimens during the acute phase of infection. The lowest  concentration of SARS-CoV-2 viral copies this assay can detect is 250  copies / mL. A negative result does not preclude SARS-CoV-2 infection  and should not be used as the sole basis for treatment or other  patient management decisions.  A negative result may occur with  improper specimen collection / handling, submission of specimen other  than nasopharyngeal swab, presence of viral mutation(s) within the  areas targeted by this assay, and inadequate number of viral copies  (<250 copies / mL). A negative result must be combined with clinical  observations, patient history, and epidemiological information. If result is POSITIVE SARS-CoV-2 target nucleic acids are DETECTED. The SARS-CoV-2 RNA is generally detectable in upper and lower  respiratory specimens dur ing the acute phase of infection.  Positive  results are indicative of active infection with SARS-CoV-2.  Clinical  correlation with patient history and other diagnostic information is  necessary to determine patient infection status.  Positive results do  not rule out bacterial infection or co-infection with other viruses. If result is PRESUMPTIVE POSTIVE SARS-CoV-2 nucleic acids MAY BE PRESENT.   A presumptive positive result was obtained on the submitted specimen  and confirmed on repeat testing.  While 2019 novel coronavirus  (SARS-CoV-2) nucleic acids may be present in the submitted sample  additional confirmatory testing may be necessary for epidemiological  and / or clinical management purposes  to differentiate between  SARS-CoV-2 and other Sarbecovirus currently known to infect humans.  If clinically indicated additional testing with an  alternate test  methodology [phone] is advised. The SARS-CoV-2 RNA is generally  detectable in upper and lower respiratory sp ecimens during the acute  phase of infection. The expected result is Negative. Fact Sheet for Patients:  BoilerBrush.com.cyhttps://www.fda.gov/media/136312/download Fact Sheet for Healthcare Providers: https://pope.com/https://www.fda.gov/media/136313/download This test is not yet approved or cleared by the [place] FDA and has been authorized for detection and/or diagnosis of SARS-CoV-2 by FDA under an Emergency Use Authorization (EUA).  This EUA will remain in effect (meaning this test can be  used) for the duration of the COVID-19 declaration under Section 564(b)(1) of the Act, 21 U.S.C. section 360bbb-3(b)(1), unless the authorization is terminated or revoked sooner. Performed at [facility], [address] [place], [place] [address]   Hepatic function panel     Status: Abnormal   Collection Time: 01/11/19  8:55 PM  Result Value Ref Range   Total Protein 7.2 6.5 - 8.1 g/dL   Albumin 4.2 3.5 - 5.0 g/dL   AST 20 15 - 41 U/L   ALT 19 0 - 44 U/L   Alkaline Phosphatase 48 38 - 126 U/L   Total Bilirubin 1.4 (H) 0.3 - 1.2 mg/dL   Bilirubin, Direct 0.1 0.0 - 0.2 mg/dL   Indirect Bilirubin 1.3 (H) 0.3 - 0.9 mg/dL    Comment: Performed at [facility], [address], [place], [place] [address]  POC occult blood, ED     Status: None   Collection Time: 01/11/19  8:59 PM  Result Value Ref Range   Fecal Occult Bld NEGATIVE NEGATIVE  Protime-INR     Status: None   Collection Time: 01/11/19  9:36 PM  Result Value Ref Range   Prothrombin Time 14.9 11.4 - 15.2 seconds   INR 1.2 0.8 - 1.2    Comment: (NOTE) INR goal varies based on device and disease states. Performed at [facility], [address], [place], [place] [address]   APTT     Status: None   Collection Time: 01/11/19  9:36 PM  Result Value Ref Range   aPTT 33 24 - 36 seconds    Comment: Performed at [facility], [address] [place], [place] [address]    Dg Chest Port 1 View  Result Date: 01/11/2019 CLINICAL DATA:  Fever, left-sided chest pain EXAM: PORTABLE CHEST 1 VIEW COMPARISON:  None. FINDINGS: The heart size and mediastinal contours are within normal limits. Patchy airspace opacity seen at the left lung base. The visualized skeletal structures are unremarkable. IMPRESSION: Patchy airspace opacity seen at the left lung base. The findings in the lungs are nonspecific, but concerning for atypical infection, which includes viral pneumonia. Electronically Signed   By: Jonna Clark M.D.   On: 01/11/2019 22:08   Dg Abd Portable 1 View  Addendum Date: 01/11/2019   ADDENDUM REPORT: 01/11/2019 21:51 ADDENDUM: Hazy airspace disease at the left lung base consistent with pneumonia. Electronically Signed   By: Elige Ko   On: 01/11/2019 21:51   Result Date: 01/11/2019 CLINICAL DATA:  Evaluate for free air, perforation EXAM: PORTABLE ABDOMEN - 1 VIEW COMPARISON:  None. FINDINGS: The bowel gas pattern is normal. No pneumoperitoneum. No radio-opaque calculi or other significant radiographic abnormality are seen. IMPRESSION: No pneumoperitoneum. Electronically Signed: By: Elige Ko On: 01/11/2019 21:33   Ct Angio Abd/pel W And/or Wo Contrast  Result Date: 01/11/2019 CLINICAL DATA:  GI bleed, covid sepsis left lower quadrant abdominal pain to . EXAM: CTA ABDOMEN AND PELVIS wITHOUT AND WITH CONTRAST TECHNIQUE: Multidetector CT imaging of the abdomen and pelvis was performed using the standard protocol during bolus administration of intravenous contrast. Multiplanar reconstructed images and MIPs were obtained and reviewed to evaluate the vascular anatomy. CONTRAST:  OMNIPAQUE IOHEXOL 350 MG/ML SOLN COMPARISON:  None. FINDINGS: CTA ABDOMEN AND PELVIS FINDINGS VASCULAR Aorta: Normal caliber aorta without aneurysm, dissection, vasculitis or hemodynamically significant stenosis. There is mild aortic  atherosclerosis. Celiac: No aneurysm, dissection or hemodynamically significant stenosis. Normal branching pattern  SMA: Widely patent without dissection or stenosis. Renals: Single renal arteries bilaterally. No aneurysm, dissection, stenosis or evidence of fibromuscular dysplasia. IMA: Patent without abnormality. Inflow: No aneurysm, stenosis or dissection. Veins: Normal course and caliber of the major veins. Assessment is otherwise limited by the arterial dominant contrast phase. Review of the MIP images confirms the above findings. Review of the MIP images confirms the above findings. NON-VASCULAR Lower chest: There is patchy airspace opacity seen throughout the left base. Hepatobiliary: No solid liver abnormality is seen. No gallstones, gallbladder wall thickening, or biliary dilatation. Pancreas: Unremarkable. No pancreatic ductal dilatation or surrounding inflammatory changes. Spleen: Normal in size without significant abnormality. Adrenals/Urinary Tract: Adrenal glands are unremarkable. Kidneys are normal, without renal calculi, solid lesion, or hydronephrosis. Bladder is unremarkable. Stomach/Bowel: The stomach appears to be grossly unremarkable. There small air bubbles seen within small bowel in the left mid abdomen. No definite bowel wall thickening or surrounding free air is seen. Colonic diverticula noted without definite diverticulitis. There is question Lymphatic: No enlarged abdominal or pelvic lymph nodes. Reproductive: No mass or other significant abnormality. Other: No abdominal wall hernia or abnormality. No abdominopelvic ascites. Musculoskeletal: No acute or significant osseous findings. Review of the MIP images confirms the above findings IMPRESSION: VASCULAR 1. No acute vascular abnormality. 2. NON-VASCULAR 1. Patchy airspace opacity seen throughout the left lung base. This could be due to atypical viral infectious etiology. 2. Small air bubbles seen within the left mid abdomen likely within  small bowel loops. No definite bowel wall thickening or surrounding inflammatory changes. Not thought to likely represent pneumatosis. However if further evaluation is warranted would recommend repeat CT with oral contrast. 3. Diverticulosis without definite diverticulitis. These results were called by telephone at the time of interpretation on 01/11/2019 at 10:01 pm to provider [facility], who verbally acknowledged these results. Electronically Signed   By: Prudencio Pair M.D.   On: 01/11/2019 22:04    Review of Systems  Constitutional: Positive for chills and fever.  Respiratory: Positive for shortness of breath.   Gastrointestinal: Positive for abdominal pain (LLQ, LUQ, RLQ), blood in stool, diarrhea, nausea and vomiting.  Neurological: Positive for loss of consciousness (15 minute episode following nausea, diarrhea and hematochezia).   Blood pressure 132/63, pulse (!) 108, temperature (!) 101 F (38.3 C), temperature source Oral, resp. rate 16, height 5\' 10"  (1.778 m), weight 77.1 kg, SpO2 95 %. Physical Exam  Constitutional: He is oriented to person, place, and time. He appears well-developed and well-nourished.  Cardiovascular: Normal rate, regular rhythm and normal heart sounds.  Respiratory: Effort normal.  rhonichi in bilateral lung bases  GI: There is abdominal tenderness (Generalized pain to abdominal palpation.). There is guarding.  Neurological: He is alert and oriented to person, place, and time.  Skin: Skin is warm and dry.  Psychiatric: He has a normal mood and affect. His behavior is normal. Thought content normal.      Assessment/Plan: Patient is a 43 yo male with severe abdominal pain, vomiting, diarrhea, hematochezia and syncopal episode.  CT chest and abdomen consistent with airspace opacity in left lung base, air bubbles in left mid abdomen within small bowel loops, and diverticulosis without diverticulitis.  On exam, patient has significant abdominal symptoms with  pain to light palpation and guarding.    - Patient will be admitted by hospitalist for pneumonia. - No specific evidence of acute mesenteric ischemia or diverticulitis at this time.  Surgical team will continue to follow.  If symptoms  worsen or persist, consider contrast CT abdomen. - IV antibiotics - NPO, bowel rest  Francene Boyers 01/11/2019, 10:44 PM

## 2019-01-12 ENCOUNTER — Inpatient Hospital Stay (HOSPITAL_COMMUNITY): Payer: Self-pay

## 2019-01-12 ENCOUNTER — Encounter (HOSPITAL_COMMUNITY): Payer: Self-pay | Admitting: Internal Medicine

## 2019-01-12 DIAGNOSIS — I361 Nonrheumatic tricuspid (valve) insufficiency: Secondary | ICD-10-CM

## 2019-01-12 LAB — COMPREHENSIVE METABOLIC PANEL
ALT: 15 U/L (ref 0–44)
AST: 16 U/L (ref 15–41)
Albumin: 3.1 g/dL — ABNORMAL LOW (ref 3.5–5.0)
Alkaline Phosphatase: 39 U/L (ref 38–126)
Anion gap: 9 (ref 5–15)
BUN: 10 mg/dL (ref 6–20)
CO2: 23 mmol/L (ref 22–32)
Calcium: 8.2 mg/dL — ABNORMAL LOW (ref 8.9–10.3)
Chloride: 108 mmol/L (ref 98–111)
Creatinine, Ser: 1.03 mg/dL (ref 0.61–1.24)
GFR calc Af Amer: >60 mL/min (ref >60)
GFR calc non Af Amer: >60 mL/min (ref >60)
Glucose, Bld: 92 mg/dL (ref 70–99)
Potassium: 3.4 mmol/L — ABNORMAL LOW (ref 3.5–5.1)
Sodium: 140 mmol/L (ref 135–145)
Total Bilirubin: 1.1 mg/dL (ref 0.3–1.2)
Total Protein: 5.8 g/dL — ABNORMAL LOW (ref 6.5–8.1)

## 2019-01-12 LAB — RESPIRATORY PANEL BY PCR

## 2019-01-12 LAB — CBC
HCT: 32.4 % — ABNORMAL LOW (ref 39.0–52.0)
Hemoglobin: 10.9 g/dL — ABNORMAL LOW (ref 13.0–17.0)
MCH: 33.5 pg (ref 26.0–34.0)
MCHC: 33.6 g/dL (ref 30.0–36.0)
MCV: 99.7 fL (ref 80.0–100.0)
Platelets: 212 10*3/uL (ref 150–400)
RBC: 3.25 MIL/uL — ABNORMAL LOW (ref 4.22–5.81)
RDW: 11.7 % (ref 11.5–15.5)
WBC: 20 10*3/uL — ABNORMAL HIGH (ref 4.0–10.5)
nRBC: 0 % (ref 0.0–0.2)

## 2019-01-12 LAB — PROCALCITONIN: Procalcitonin: 0.81 ng/mL

## 2019-01-12 LAB — LACTIC ACID, PLASMA: Lactic Acid, Venous: 0.8 mmol/L (ref 0.5–1.9)

## 2019-01-12 LAB — RAPID URINE DRUG SCREEN, HOSP PERFORMED
Amphetamines: NOT DETECTED
Barbiturates: NOT DETECTED
Benzodiazepines: NOT DETECTED
Cocaine: NOT DETECTED
Opiates: POSITIVE — AB
Tetrahydrocannabinol: POSITIVE — AB

## 2019-01-12 LAB — ECHOCARDIOGRAM COMPLETE
Height: 70 in
Weight: 2720 oz

## 2019-01-12 LAB — STREP PNEUMONIAE URINARY ANTIGEN: Strep Pneumo Urinary Antigen: POSITIVE — AB

## 2019-01-12 LAB — ABO/RH: ABO/RH(D): O POS

## 2019-01-12 LAB — MAGNESIUM: Magnesium: 1.8 mg/dL (ref 1.7–2.4)

## 2019-01-12 LAB — PHOSPHORUS: Phosphorus: 2 mg/dL — ABNORMAL LOW (ref 2.5–4.6)

## 2019-01-12 LAB — HIV ANTIBODY (ROUTINE TESTING W REFLEX): HIV Screen 4th Generation wRfx: NONREACTIVE

## 2019-01-12 LAB — D-DIMER, QUANTITATIVE: D-Dimer, Quant: 0.41 ug/mL-FEU (ref 0.00–0.50)

## 2019-01-12 LAB — CK: Total CK: 127 U/L (ref 49–397)

## 2019-01-12 MED ORDER — ACETAMINOPHEN 650 MG RE SUPP: 650.0000 mg | Freq: Four times a day (QID) | RECTAL | Status: DC | PRN

## 2019-01-12 MED ORDER — ONDANSETRON HCL 4 MG/2ML IJ SOLN: 4.0000 mg | Freq: Four times a day (QID) | INTRAMUSCULAR | Status: DC | PRN

## 2019-01-12 MED ORDER — SODIUM CHLORIDE 0.9 % IV SOLN
INTRAVENOUS | Status: AC
  Administered 2019-01-12: 08:00:00 via INTRAVENOUS

## 2019-01-12 MED ORDER — ACETAMINOPHEN 325 MG PO TABS
650.0000 mg | ORAL_TABLET | Freq: Four times a day (QID) | ORAL | Status: DC | PRN
  Administered 2019-01-14: 650 mg via ORAL
  Filled 2019-01-12: qty 2

## 2019-01-12 MED ORDER — ONDANSETRON HCL 4 MG PO TABS: 4.0000 mg | ORAL_TABLET | Freq: Four times a day (QID) | ORAL | Status: DC | PRN

## 2019-01-12 MED ORDER — SODIUM CHLORIDE 0.9 % IV BOLUS
1000.0000 mL | Freq: Once | INTRAVENOUS | Status: AC
  Administered 2019-01-12: 13:00:00 1000 mL via INTRAVENOUS

## 2019-01-12 MED ORDER — IOHEXOL 300 MG/ML  SOLN
100.0000 mL | Freq: Once | INTRAMUSCULAR | Status: AC | PRN
  Administered 2019-01-12: 13:00:00 80 mL via INTRAVENOUS

## 2019-01-12 NOTE — Progress Notes (Signed)
PROGRESS NOTE    Antonio Rowland  LYY:[id number] DOB: [birth year] DOA: 01/11/2019 PCP: Patient, No Pcp Per    Brief Narrative:  43yo who presented to ED with complaints of syncopal episode relating to marked abd discomfort. Abd pain noted to be acute in onset with reports of BRBPR. Initial CT abd with findings consistent with PNA as well as possible pneumatosis. General Surgery was consulted. Hospitalist was consulted for consideration for medical admission.  Assessment & Plan:   Active Problems:   Sepsis ([place])   Bright red blood per rectum   CAP (community acquired pneumonia)   Tobacco abuse  Present on Admission: . Sepsis secondary to PNA and pneumatosis present on admit  -SIRS criteria met with  elevated white blood cell count,  tachycardia ,   fever.   - L sided PNA seen on CXR and again demonstrated on CT abd - CT abd also with findings worrisome for pneumatosis - COVID is neg, rhinovirus positive -Pt is continued on empiric vanc, cefepime, and flagyl -General surgery consulted per below -Lactate of 0.8 -Will continue aggressive IVF hydration  . CAP (community acquired pneumonia) -L sided PNA noted on imaging, personally reviewed -Pt is rhinovirus positive -Will continue broad spectrum abx to cover CAP, although can consider viral pneumonia  . Tobacco abuse -    - cessation done at time of admission  - order nicotine patch   - nursing tobacco cessation protocol  Abd pain, possible pneumatosis -CT abd personally reviewed with findings suggestive of pneumatosis -General Surgery consulted, appreciate input. Recommenation for f/u CT with contrast. Depending on results, may require laparotomy vs diagnostic laparoscopy -Will continue above broad spectrum abx -Will f/u on blood cx -Repeat CBC in AM. Most recent lactate of 0.8. Stools heme neg  Syncope -Possible vasovagal given relation to marked abd pain above -Stable thus far -2d echo reviewed, normal LVEF,  unremarkable  DVT prophylaxis: SCD's Code Status: Full Family Communication: Pt in room, family at bedside Disposition Plan: Uncertain at this time  Consultants:   General Surgery  Procedures:     Antimicrobials: Anti-infectives (From admission, onward)   Start     Dose/Rate Route Frequency Ordered Stop   01/12/19 0600  metroNIDAZOLE (FLAGYL) IVPB 500 mg     500 mg 100 mL/hr over 60 Minutes Intravenous Every 8 hours 01/11/19 2305     01/12/19 0000  vancomycin (VANCOCIN) IVPB 750 mg/150 ml premix     750 mg 150 mL/hr over 60 Minutes Intravenous Every 8 hours 01/11/19 2313     01/12/19 0000  ceFEPIme (MAXIPIME) 2 g in sodium chloride 0.9 % 100 mL IVPB     2 g 200 mL/hr over 30 Minutes Intravenous Every 8 hours 01/11/19 2313     01/11/19 2300  doxycycline (VIBRAMYCIN) 100 mg in sodium chloride 0.9 % 250 mL IVPB     100 mg 125 mL/hr over 120 Minutes Intravenous  Once 01/11/19 2248 01/12/19 0114   01/11/19 2215  azithromycin (ZITHROMAX) 500 mg in sodium chloride 0.9 % 250 mL IVPB  Status:  Discontinued     500 mg 250 mL/hr over 60 Minutes Intravenous  Once 01/11/19 2206 01/11/19 2246   01/11/19 2045  cefTRIAXone (ROCEPHIN) 2 g in sodium chloride 0.9 % 100 mL IVPB     2 g 200 mL/hr over 30 Minutes Intravenous  Once 01/11/19 2037 01/11/19 2220   01/11/19 2045  metroNIDAZOLE (FLAGYL) IVPB 500 mg     500 mg 100 mL/hr over 60 Minutes  Intravenous  Once 01/11/19 2037 01/11/19 2253       Subjective: Complains of continued LUQ abd pain. Feeling thirsty  Objective: Vitals:   01/12/19 1030 01/12/19 1045 01/12/19 1200 01/12/19 1533  BP: 111/67 114/75 107/61 125/81  Pulse: 72 68 76 61  Resp: (!) 23 18 20 17   Temp:    98 F (36.7 C)  TempSrc:    Axillary  SpO2: 99% 98% 99% 100%  Weight:      Height:        Intake/Output Summary (Last 24 hours) at 01/12/2019 1731 Last data filed at 01/12/2019 1050 Gross per 24 hour  Intake 5450 ml  Output 2050 ml  Net 3400 ml   Filed  Weights   01/11/19 2025  Weight: 77.1 kg    Examination:  General exam: Appears to be uncomfortable and in pain Respiratory system: Clear to auscultation. Respiratory effort normal. Cardiovascular system: S1 & S2 heard, RRR Gastrointestinal system: Marked abd tenderness, predominately in LUQ Central nervous system: Alert and oriented. No focal neurological deficits. Extremities: Symmetric 5 x 5 power. Skin: No rashes, lesions  Psychiatry: Judgement and insight appear normal. Mood & affect appropriate.   Data Reviewed: I have personally reviewed following labs and imaging studies  CBC: Recent Labs  Lab 01/11/19 1926 01/12/19 0805  WBC 14.4* 20.0*  HGB 12.2* 10.9*  HCT 37.5* 32.4*  MCV 100.8* 99.7  PLT 250 494   Basic Metabolic Panel: Recent Labs  Lab 01/11/19 1926 01/12/19 0805  NA 138 140  K 3.8 3.4*  CL 103 108  CO2 24 23  GLUCOSE 99 92  BUN 12 10  CREATININE 1.10 1.03  CALCIUM 9.1 8.2*  MG  --  1.8  PHOS  --  2.0*   GFR: Estimated Creatinine Clearance: 96.5 mL/min (by C-G formula based on SCr of 1.03 mg/dL). Liver Function Tests: Recent Labs  Lab 01/11/19 2055 01/12/19 0805  AST 20 16  ALT 19 15  ALKPHOS 48 39  BILITOT 1.4* 1.1  PROT 7.2 5.8*  ALBUMIN 4.2 3.1*   Recent Labs  Lab 01/11/19 1926  LIPASE 17   No results for input(s): AMMONIA in the last 168 hours. Coagulation Profile: Recent Labs  Lab 01/11/19 2136  INR 1.2   Cardiac Enzymes: Recent Labs  Lab 01/12/19 0243  CKTOTAL 127   BNP (last 3 results) No results for input(s): PROBNP in the last 8760 hours. HbA1C: No results for input(s): HGBA1C in the last 72 hours. CBG: No results for input(s): GLUCAP in the last 168 hours. Lipid Profile: No results for input(s): CHOL, HDL, LDLCALC, TRIG, CHOLHDL, LDLDIRECT in the last 72 hours. Thyroid Function Tests: No results for input(s): TSH, T4TOTAL, FREET4, T3FREE, THYROIDAB in the last 72 hours. Anemia Panel: No results for  input(s): VITAMINB12, FOLATE, FERRITIN, TIBC, IRON, RETICCTPCT in the last 72 hours. Sepsis Labs: Recent Labs  Lab 01/11/19 2020 01/11/19 2251 01/12/19 0000 01/12/19 0012  PROCALCITON  --   --   --  0.81  LATICACIDVEN 1.5 0.9 0.8  --     Recent Results (from the past 240 hour(s))  Culture, blood (routine x 2)     Status: None (Preliminary result)   Collection Time: 01/11/19  8:21 PM   Specimen: BLOOD  Result Value Ref Range Status   Specimen Description BLOOD RIGHT ANTECUBITAL  Final   Special Requests   Final    BOTTLES DRAWN AEROBIC AND ANAEROBIC Blood Culture results may not be optimal due to an  excessive volume of blood received in culture bottles   Culture   Final    NO GROWTH < 24 HOURS Performed at [facility] [address], [place], [place] [address]    Report Status PENDING  Incomplete  Culture, blood (routine x 2)     Status: None (Preliminary result)   Collection Time: 01/11/19  8:30 PM   Specimen: BLOOD  Result Value Ref Range Status   Specimen Description BLOOD LEFT ANTECUBITAL  Final   Special Requests   Final    BOTTLES DRAWN AEROBIC AND ANAEROBIC Blood Culture results may not be optimal due to an excessive volume of blood received in culture bottles   Culture   Final    NO GROWTH < 24 HOURS Performed at [facility], [place] [address] [place], [place] [address]    Report Status PENDING  Incomplete  SARS Coronavirus 2 [facility] order, Performed in [facility] hospital lab) Nasopharyngeal Nasopharyngeal Swab     Status: None   Collection Time: 01/11/19  8:30 PM   Specimen: Nasopharyngeal Swab  Result Value Ref Range Status   SARS Coronavirus 2 NEGATIVE NEGATIVE Final    Comment: (NOTE) If result is NEGATIVE SARS-CoV-2 target nucleic acids are NOT DETECTED. The SARS-CoV-2 RNA is generally detectable in upper and lower  respiratory specimens during the acute phase of infection. The lowest  concentration of SARS-CoV-2 viral copies this assay can  detect is 250  copies / mL. A negative result does not preclude SARS-CoV-2 infection  and should not be used as the sole basis for treatment or other  patient management decisions.  A negative result may occur with  improper specimen collection / handling, submission of specimen other  than nasopharyngeal swab, presence of viral mutation(s) within the  areas targeted by this assay, and inadequate number of viral copies  (<250 copies / mL). A negative result must be combined with clinical  observations, patient history, and epidemiological information. If result is POSITIVE SARS-CoV-2 target nucleic acids are DETECTED. The SARS-CoV-2 RNA is generally detectable in upper and lower  respiratory specimens dur ing the acute phase of infection.  Positive  results are indicative of active infection with SARS-CoV-2.  Clinical  correlation with patient history and other diagnostic information is  necessary to determine patient infection status.  Positive results do  not rule out bacterial infection or co-infection with other viruses. If result is PRESUMPTIVE POSTIVE SARS-CoV-2 nucleic acids MAY BE PRESENT.   A presumptive positive result was obtained on the submitted specimen  and confirmed on repeat testing.  While 2019 novel coronavirus  (SARS-CoV-2) nucleic acids may be present in the submitted sample  additional confirmatory testing may be necessary for epidemiological  and / or clinical management purposes  to differentiate between  SARS-CoV-2 and other Sarbecovirus currently known to infect humans.  If clinically indicated additional testing with an alternate test  methodology [phone]) is advised. The SARS-CoV-2 RNA is generally  detectable in upper and lower respiratory sp ecimens during the acute  phase of infection. The expected result is Negative. Fact Sheet for Patients:  StrictlyIdeas.no Fact Sheet for Healthcare Providers:  BankingDealers.co.za This test is not yet approved or cleared by the [place] FDA and has been authorized for detection and/or diagnosis of SARS-CoV-2 by FDA under an Emergency Use Authorization (EUA).  This EUA will remain in effect (meaning this test can be used) for the duration of the COVID-19 declaration under Section 564(b)(1) of the Act,  21 U.S.C. section 360bbb-3(b)(1), unless the authorization is terminated or revoked sooner. Performed at [facility], [place] [address], [place], [place] [address]   Respiratory Panel by PCR     Status: Abnormal   Collection Time: 01/11/19 11:32 PM   Specimen: Nasopharyngeal Swab; Respiratory  Result Value Ref Range Status   Adenovirus NOT DETECTED NOT DETECTED Final   Coronavirus 229E NOT DETECTED NOT DETECTED Final    Comment: (NOTE) The Coronavirus on the Respiratory Panel, DOES NOT test for the novel  Coronavirus (2019 nCoV)    Coronavirus HKU1 NOT DETECTED NOT DETECTED Final   Coronavirus NL63 NOT DETECTED NOT DETECTED Final   Coronavirus OC43 NOT DETECTED NOT DETECTED Final   Metapneumovirus NOT DETECTED NOT DETECTED Final   Rhinovirus / Enterovirus DETECTED (A) NOT DETECTED Final   Influenza A NOT DETECTED NOT DETECTED Final   Influenza B NOT DETECTED NOT DETECTED Final   Parainfluenza Virus 1 NOT DETECTED NOT DETECTED Final   Parainfluenza Virus 2 NOT DETECTED NOT DETECTED Final   Parainfluenza Virus 3 NOT DETECTED NOT DETECTED Final   Parainfluenza Virus 4 NOT DETECTED NOT DETECTED Final   Respiratory Syncytial Virus NOT DETECTED NOT DETECTED Final   Bordetella pertussis NOT DETECTED NOT DETECTED Final   Chlamydophila pneumoniae NOT DETECTED NOT DETECTED Final   Mycoplasma pneumoniae NOT DETECTED NOT DETECTED Final    Comment: Performed at [facility], [place]. [address], [place], [place] [address]     Radiology Studies: Dg Chest Port 1 View  Result Date: 01/11/2019 CLINICAL DATA:  Fever,  left-sided chest pain EXAM: PORTABLE CHEST 1 VIEW COMPARISON:  None. FINDINGS: The heart size and mediastinal contours are within normal limits. Patchy airspace opacity seen at the left lung base. The visualized skeletal structures are unremarkable. IMPRESSION: Patchy airspace opacity seen at the left lung base. The findings in the lungs are nonspecific, but concerning for atypical infection, which includes viral pneumonia. Electronically Signed   By: Prudencio Pair M.D.   On: 01/11/2019 22:08   Dg Abd Portable 1 View  Addendum Date: 01/11/2019   ADDENDUM REPORT: 01/11/2019 21:51 ADDENDUM: Hazy airspace disease at the left lung base consistent with pneumonia. Electronically Signed   By: Kathreen Devoid   On: 01/11/2019 21:51   Result Date: 01/11/2019 CLINICAL DATA:  Evaluate for free air, perforation EXAM: PORTABLE ABDOMEN - 1 VIEW COMPARISON:  None. FINDINGS: The bowel gas pattern is normal. No pneumoperitoneum. No radio-opaque calculi or other significant radiographic abnormality are seen. IMPRESSION: No pneumoperitoneum. Electronically Signed: By: Kathreen Devoid On: 01/11/2019 21:33   Ct Angio Abd/pel W And/or Wo Contrast  Result Date: 01/11/2019 CLINICAL DATA:  GI bleed, covid sepsis left lower quadrant abdominal pain to . EXAM: CTA ABDOMEN AND PELVIS wITHOUT AND WITH CONTRAST TECHNIQUE: Multidetector CT imaging of the abdomen and pelvis was performed using the standard protocol during bolus administration of intravenous contrast. Multiplanar reconstructed images and MIPs were obtained and reviewed to evaluate the vascular anatomy. CONTRAST:  111m OMNIPAQUE IOHEXOL 350 MG/ML SOLN COMPARISON:  None. FINDINGS: CTA ABDOMEN AND PELVIS FINDINGS VASCULAR Aorta: Normal caliber aorta without aneurysm, dissection, vasculitis or hemodynamically significant stenosis. There is mild aortic atherosclerosis. Celiac: No aneurysm, dissection or hemodynamically significant stenosis. Normal branching pattern SMA: Widely patent  without dissection or stenosis. Renals: Single renal arteries bilaterally. No aneurysm, dissection, stenosis or evidence of fibromuscular dysplasia. IMA: Patent without abnormality. Inflow: No aneurysm, stenosis or dissection. Veins: Normal course and caliber of the  major veins. Assessment is otherwise limited by the arterial dominant contrast phase. Review of the MIP images confirms the above findings. Review of the MIP images confirms the above findings. NON-VASCULAR Lower chest: There is patchy airspace opacity seen throughout the left base. Hepatobiliary: No solid liver abnormality is seen. No gallstones, gallbladder wall thickening, or biliary dilatation. Pancreas: Unremarkable. No pancreatic ductal dilatation or surrounding inflammatory changes. Spleen: Normal in size without significant abnormality. Adrenals/Urinary Tract: Adrenal glands are unremarkable. Kidneys are normal, without renal calculi, solid lesion, or hydronephrosis. Bladder is unremarkable. Stomach/Bowel: The stomach appears to be grossly unremarkable. There small air bubbles seen within small bowel in the left mid abdomen. No definite bowel wall thickening or surrounding free air is seen. Colonic diverticula noted without definite diverticulitis. There is question Lymphatic: No enlarged abdominal or pelvic lymph nodes. Reproductive: No mass or other significant abnormality. Other: No abdominal wall hernia or abnormality. No abdominopelvic ascites. Musculoskeletal: No acute or significant osseous findings. Review of the MIP images confirms the above findings IMPRESSION: VASCULAR 1. No acute vascular abnormality. 2. NON-VASCULAR 1. Patchy airspace opacity seen throughout the left lung base. This could be due to atypical viral infectious etiology. 2. Small air bubbles seen within the left mid abdomen likely within small bowel loops. No definite bowel wall thickening or surrounding inflammatory changes. Not thought to likely represent pneumatosis.  However if further evaluation is warranted would recommend repeat CT with oral contrast. 3. Diverticulosis without definite diverticulitis. These results were called by telephone at the time of interpretation on 01/11/2019 at 10:01 pm to provider [facility], who verbally acknowledged these results. Electronically Signed   By: Prudencio Pair M.D.   On: 01/11/2019 22:04    Scheduled Meds: Continuous Infusions: . sodium chloride 125 mL/hr at 01/12/19 0811  . ceFEPime (MAXIPIME) IV Stopped (01/12/19 1626)  . metronidazole 500 mg (01/12/19 1545)  . vancomycin 750 mg (01/12/19 1627)     LOS: 1 day   Marylu Lund, MD [facility] Pager On Amion  If 7PM-7AM, please contact night-coverage 01/12/2019, 5:31 PM

## 2019-01-12 NOTE — Progress Notes (Signed)
2D Echocardiogram has been performed. [facility] Eleanore Junio RDCS 01/12/19 14:55

## 2019-01-12 NOTE — ED Notes (Signed)
ED TO INPATIENT HANDOFF REPORT  ED Nurse Name and Phone #:   Lucious Groves [phone]  S Name/Age/Gender Letta Median 43 y.o. male Room/Bed: 028C/028C  Code Status   Code Status: Not on file  Home/SNF/Other Home {Patient oriented x4 Is this baseline? yes  Triage Complete: Triage complete  Chief Complaint syncope blood in stool stomach pain  Triage Note Patient states that he had bloody bowel movement today with 1 episode of vomiting and than reports syncopal episode. Patient alert and oriented, complains of ongoing generalized abdominal pain   Allergies Allergies  Allergen Reactions  . Azithromycin     hives  . Norco [Hydrocodone-Acetaminophen]     Weakness  . Ritalin [Methylphenidate]     swelling    Level of Care/Admitting Diagnosis ED Disposition    ED Disposition Condition Comment   Admit  Hospital Area: [facility] [[id number]]  Level of Care: Progressive [102]  Covid Evaluation: Confirmed COVID Negative  Diagnosis: Sepsis [facility]) [[id number]]  Admitting Physician: Therisa Doyne [[id number]]  Attending Physician: Therisa Doyne [[id number]]  Estimated length of stay: 3 - 4 days  Certification:: I certify this patient will need inpatient services for at least 2 midnights  PT Class (Do Not Modify): Inpatient [101]  PT Acc Code (Do Not Modify): Private [1]       B Medical/Surgery History History reviewed. No pertinent past medical history. History reviewed. No pertinent surgical history.   A IV Location/Drains/Wounds Patient Lines/Drains/Airways Status   Active Line/Drains/Airways    Name:   Placement date:   Placement time:   Site:   Days:   Peripheral IV 01/11/19 Right Antecubital   01/11/19    2020    Antecubital   1          Intake/Output Last 24 hours  Intake/Output Summary (Last 24 hours) at 01/12/2019 0429 Last data filed at 01/12/2019 0303 Gross per 24 hour  Intake 5200 ml  Output 2050 ml  Net 3150 ml     Labs/Imaging Results for orders placed or performed during the hospital encounter of 01/11/19 (from the past 48 hour(s))  Basic metabolic panel     Status: None   Collection Time: 01/11/19  7:26 PM  Result Value Ref Range   Sodium 138 135 - 145 mmol/L   Potassium 3.8 3.5 - 5.1 mmol/L   Chloride 103 98 - 111 mmol/L   CO2 24 22 - 32 mmol/L   Glucose, Bld 99 70 - 99 mg/dL   BUN 12 6 - 20 mg/dL   Creatinine, Ser 1.91 0.61 - 1.24 mg/dL   Calcium 9.1 8.9 - 47.8 mg/dL   GFR calc non Af Amer >60 >60 mL/min   GFR calc Af Amer >60 >60 mL/min   Anion gap 11 5 - 15    Comment: Performed at [facility], [address], [place], [place] [address]  CBC     Status: Abnormal   Collection Time: 01/11/19  7:26 PM  Result Value Ref Range   WBC 14.4 (H) 4.0 - 10.5 K/uL   RBC 3.72 (L) 4.22 - 5.81 MIL/uL   Hemoglobin 12.2 (L) 13.0 - 17.0 g/dL   HCT 13.0 (L) 86.5 - 78.4 %   MCV 100.8 (H) 80.0 - 100.0 fL   MCH 32.8 26.0 - 34.0 pg   MCHC 32.5 30.0 - 36.0 g/dL   RDW 69.6 29.5 - 28.4 %   Platelets 250 150 - 400 K/uL   nRBC 0.0 0.0 -  0.2 %    Comment: Performed at [facility], [address] [place], [place] [address]  Lipase, blood     Status: None   Collection Time: 01/11/19  7:26 PM  Result Value Ref Range   Lipase 17 11 - 51 U/L    Comment: Performed at [facility], [address] [place], [place] [address]  Lactic acid, plasma     Status: None   Collection Time: 01/11/19  8:20 PM  Result Value Ref Range   Lactic Acid, Venous 1.5 0.5 - 1.9 mmol/L    Comment: Performed at [facility], [address] [place], [place] [address]  Type and screen [facility]     Status: None   Collection Time: 01/11/19  8:21 PM  Result Value Ref Range   ABO/RH(D) O POS    Antibody Screen NEG    Sample Expiration      01/14/2019,2359 Performed at [facility], [address] [place], [place] [address]   Urinalysis, Routine w reflex microscopic      Status: Abnormal   Collection Time: 01/11/19  8:21 PM  Result Value Ref Range   Color, Urine YELLOW YELLOW   APPearance CLEAR CLEAR   Specific Gravity, Urine 1.029 1.005 - 1.030   pH 5.0 5.0 - 8.0   Glucose, UA NEGATIVE NEGATIVE mg/dL   Hgb urine dipstick SMALL (A) NEGATIVE   Bilirubin Urine NEGATIVE NEGATIVE   Ketones, ur 20 (A) NEGATIVE mg/dL   Protein, ur NEGATIVE NEGATIVE mg/dL   Nitrite NEGATIVE NEGATIVE   Leukocytes,Ua NEGATIVE NEGATIVE   RBC / HPF 0-5 0 - 5 RBC/hpf   WBC, UA 0-5 0 - 5 WBC/hpf   Bacteria, UA NONE SEEN NONE SEEN   Mucus PRESENT     Comment: Performed at [facility], [address] [place], [place] [address]  ABO/Rh     Status: None   Collection Time: 01/11/19  8:21 PM  Result Value Ref Range   ABO/RH(D)      O POS Performed at [facility], [address] [place], [place] [address]   SARS Coronavirus 2 [facility](Hospital order, Performed in [facility] lab) Nasopharyngeal Nasopharyngeal Swab     Status: None   Collection Time: 01/11/19  8:30 PM   Specimen: Nasopharyngeal Swab  Result Value Ref Range   SARS Coronavirus 2 NEGATIVE NEGATIVE    Comment: (NOTE) If result is NEGATIVE SARS-CoV-2 target nucleic acids are NOT DETECTED. The SARS-CoV-2 RNA is generally detectable in upper and lower  respiratory specimens during the acute phase of infection. The lowest  concentration of SARS-CoV-2 viral copies this assay can detect is 250  copies / mL. A negative result does not preclude SARS-CoV-2 infection  and should not be used as the sole basis for treatment or other  patient management decisions.  A negative result may occur with  improper specimen collection / handling, submission of specimen other  than nasopharyngeal swab, presence of viral mutation(s) within the  areas targeted by this assay, and inadequate number of viral copies  (<250 copies / mL). A negative result must be combined with clinical  observations, patient history, and  epidemiological information. If result is POSITIVE SARS-CoV-2 target nucleic acids are DETECTED. The SARS-CoV-2 RNA is generally detectable in upper and lower  respiratory specimens dur ing the acute phase of infection.  Positive  results are indicative of active infection with SARS-CoV-2.  Clinical  correlation with patient history and other diagnostic information is  necessary to determine patient infection status.  Positive results do  not rule out bacterial infection or co-infection with other viruses. If result is PRESUMPTIVE POSTIVE SARS-CoV-2 nucleic acids MAY BE PRESENT.   A presumptive positive result was obtained on the submitted specimen  and confirmed on repeat testing.  While 2019 novel coronavirus  (SARS-CoV-2) nucleic acids may be present in the submitted sample  additional confirmatory testing may be necessary for epidemiological  and / or clinical management purposes  to differentiate between  SARS-CoV-2 and other Sarbecovirus currently known to infect humans.  If clinically indicated additional testing with an alternate test  methodology [phone]) is advised. The SARS-CoV-2 RNA is generally  detectable in upper and lower respiratory sp ecimens during the acute  phase of infection. The expected result is Negative. Fact Sheet for Patients:  BoilerBrush.com.cy Fact Sheet for Healthcare Providers: https://pope.com/ This test is not yet approved or cleared by the [place] FDA and has been authorized for detection and/or diagnosis of SARS-CoV-2 by FDA under an Emergency Use Authorization (EUA).  This EUA will remain in effect (meaning this test can be used) for the duration of the COVID-19 declaration under Section 564(b)(1) of the Act, 21 U.S.C. section 360bbb-3(b)(1), unless the authorization is terminated or revoked sooner. Performed at [facility], [address] [place], [place] [address]   Hepatic  function panel     Status: Abnormal   Collection Time: 01/11/19  8:55 PM  Result Value Ref Range   Total Protein 7.2 6.5 - 8.1 g/dL   Albumin 4.2 3.5 - 5.0 g/dL   AST 20 15 - 41 U/L   ALT 19 0 - 44 U/L   Alkaline Phosphatase 48 38 - 126 U/L   Total Bilirubin 1.4 (H) 0.3 - 1.2 mg/dL   Bilirubin, Direct 0.1 0.0 - 0.2 mg/dL   Indirect Bilirubin 1.3 (H) 0.3 - 0.9 mg/dL    Comment: Performed at [facility], [address], [place], [place] [address]  POC occult blood, ED     Status: None   Collection Time: 01/11/19  8:59 PM  Result Value Ref Range   Fecal Occult Bld NEGATIVE NEGATIVE  Protime-INR     Status: None   Collection Time: 01/11/19  9:36 PM  Result Value Ref Range   Prothrombin Time 14.9 11.4 - 15.2 seconds   INR 1.2 0.8 - 1.2    Comment: (NOTE) INR goal varies based on device and disease states. Performed at [facility], [address] [place], [place] [address]   APTT     Status: None   Collection Time: 01/11/19  9:36 PM  Result Value Ref Range   aPTT 33 24 - 36 seconds    Comment: Performed at [facility], [address], [place], [place] [address]  Lactic acid, plasma     Status: None   Collection Time: 01/11/19 10:51 PM  Result Value Ref Range   Lactic Acid, Venous 0.9 0.5 - 1.9 mmol/L    Comment: Performed at [facility], [address], [place], [place] [address]  Respiratory Panel by PCR     Status: Abnormal   Collection Time: 01/11/19 11:32 PM   Specimen: Nasopharyngeal Swab; Respiratory  Result Value Ref Range   Adenovirus NOT DETECTED NOT DETECTED   Coronavirus 229E NOT DETECTED NOT DETECTED    Comment: (NOTE) The Coronavirus on the Respiratory Panel,  DOES NOT test for the novel  Coronavirus (2019 nCoV)    Coronavirus HKU1 NOT DETECTED NOT DETECTED   Coronavirus NL63 NOT DETECTED NOT DETECTED   Coronavirus OC43 NOT DETECTED NOT DETECTED   Metapneumovirus NOT DETECTED NOT DETECTED   Rhinovirus / Enterovirus DETECTED (A) NOT  DETECTED   Influenza A NOT DETECTED NOT DETECTED   Influenza B NOT DETECTED NOT DETECTED   Parainfluenza Virus 1 NOT DETECTED NOT DETECTED   Parainfluenza Virus 2 NOT DETECTED NOT DETECTED   Parainfluenza Virus 3 NOT DETECTED NOT DETECTED   Parainfluenza Virus 4 NOT DETECTED NOT DETECTED   Respiratory Syncytial Virus NOT DETECTED NOT DETECTED   Bordetella pertussis NOT DETECTED NOT DETECTED   Chlamydophila pneumoniae NOT DETECTED NOT DETECTED   Mycoplasma pneumoniae NOT DETECTED NOT DETECTED    Comment: Performed at [facility], [address] [place], [place] [address]  Lactic acid, plasma     Status: None   Collection Time: 01/12/19 12:00 AM  Result Value Ref Range   Lactic Acid, Venous 0.8 0.5 - 1.9 mmol/L    Comment: Performed at [facility], [address], [place], [place] [address]  Procalcitonin     Status: None   Collection Time: 01/12/19 12:12 AM  Result Value Ref Range   Procalcitonin 0.81 ng/mL    Comment:        Interpretation: PCT > 0.5 ng/mL and <= 2 ng/mL: Systemic infection (sepsis) is possible, but other conditions are known to elevate PCT as well. (NOTE)       Sepsis PCT Algorithm           Lower Respiratory Tract                                      Infection PCT Algorithm    ----------------------------     ----------------------------         PCT < 0.25 ng/mL                PCT < 0.10 ng/mL         Strongly encourage             Strongly discourage   discontinuation of antibiotics    initiation of antibiotics    ----------------------------     -----------------------------       PCT 0.25 - 0.50 ng/mL            PCT 0.10 - 0.25 ng/mL               OR       >80% decrease in PCT            Discourage initiation of                                            antibiotics      Encourage discontinuation           of antibiotics    ----------------------------     -----------------------------         PCT >= 0.50 ng/mL              PCT 0.26 -  0.50 ng/mL                AND       <  80% decrease in PCT             Encourage initiation of                                             antibiotics       Encourage continuation           of antibiotics    ----------------------------     -----------------------------        PCT >= 0.50 ng/mL                  PCT > 0.50 ng/mL               AND         increase in PCT                  Strongly encourage                                      initiation of antibiotics    Strongly encourage escalation           of antibiotics                                     -----------------------------                                           PCT <= 0.25 ng/mL                                                 OR                                        > 80% decrease in PCT                                     Discontinue / Do not initiate                                             antibiotics Performed at [facility], [address], [place], [place] [address]   D-dimer, quantitative (not at [facility])     Status: None   Collection Time: 01/12/19  2:43 AM  Result Value Ref Range   D-Dimer, Quant 0.41 0.00 - 0.50 ug/mL-FEU    Comment: (NOTE) At the manufacturer cut-off of 0.50 ug/mL FEU, this assay has been documented to exclude PE with a sensitivity and negative predictive value of 97 to 99%.  At this time, this assay has not been approved by the FDA to exclude DVT/VTE. Results should be correlated with clinical presentation. Performed at [facility], [address] [place], [place] [address]   CK     Status: None   Collection Time: 01/12/19  2:43 AM  Result Value Ref Range   Total CK 127 49 - 397 U/L    Comment: Performed at [facility], [address] [place], [place] [address]   Dg Chest Port 1 View  Result Date: 01/11/2019 CLINICAL DATA:  Fever, left-sided chest pain EXAM: PORTABLE CHEST 1 VIEW COMPARISON:  None. FINDINGS: The heart size and mediastinal contours are  within normal limits. Patchy airspace opacity seen at the left lung base. The visualized skeletal structures are unremarkable. IMPRESSION: Patchy airspace opacity seen at the left lung base. The findings in the lungs are nonspecific, but concerning for atypical infection, which includes viral pneumonia. Electronically Signed   By: Prudencio Pair M.D.   On: 01/11/2019 22:08   Dg Abd Portable 1 View  Addendum Date: 01/11/2019   ADDENDUM REPORT: 01/11/2019 21:51 ADDENDUM: Hazy airspace disease at the left lung base consistent with pneumonia. Electronically Signed   By: Kathreen Devoid   On: 01/11/2019 21:51   Result Date: 01/11/2019 CLINICAL DATA:  Evaluate for free air, perforation EXAM: PORTABLE ABDOMEN - 1 VIEW COMPARISON:  None. FINDINGS: The bowel gas pattern is normal. No pneumoperitoneum. No radio-opaque calculi or other significant radiographic abnormality are seen. IMPRESSION: No pneumoperitoneum. Electronically Signed: By: Kathreen Devoid On: 01/11/2019 21:33   Ct Angio Abd/pel W And/or Wo Contrast  Result Date: 01/11/2019 CLINICAL DATA:  GI bleed, covid sepsis left lower quadrant abdominal pain to . EXAM: CTA ABDOMEN AND PELVIS wITHOUT AND WITH CONTRAST TECHNIQUE: Multidetector CT imaging of the abdomen and pelvis was performed using the standard protocol during bolus administration of intravenous contrast. Multiplanar reconstructed images and MIPs were obtained and reviewed to evaluate the vascular anatomy. CONTRAST:  157mL OMNIPAQUE IOHEXOL 350 MG/ML SOLN COMPARISON:  None. FINDINGS: CTA ABDOMEN AND PELVIS FINDINGS VASCULAR Aorta: Normal caliber aorta without aneurysm, dissection, vasculitis or hemodynamically significant stenosis. There is mild aortic atherosclerosis. Celiac: No aneurysm, dissection or hemodynamically significant stenosis. Normal branching pattern SMA: Widely patent without dissection or stenosis. Renals: Single renal arteries bilaterally. No aneurysm, dissection, stenosis or evidence  of fibromuscular dysplasia. IMA: Patent without abnormality. Inflow: No aneurysm, stenosis or dissection. Veins: Normal course and caliber of the major veins. Assessment is otherwise limited by the arterial dominant contrast phase. Review of the MIP images confirms the above findings. Review of the MIP images confirms the above findings. NON-VASCULAR Lower chest: There is patchy airspace opacity seen throughout the left base. Hepatobiliary: No solid liver abnormality is seen. No gallstones, gallbladder wall thickening, or biliary dilatation. Pancreas: Unremarkable. No pancreatic ductal dilatation or surrounding inflammatory changes. Spleen: Normal in size without significant abnormality. Adrenals/Urinary Tract: Adrenal glands are unremarkable. Kidneys are normal, without renal calculi, solid lesion, or hydronephrosis. Bladder is unremarkable. Stomach/Bowel: The stomach appears to be grossly unremarkable. There small air bubbles seen within small bowel in the left mid abdomen. No definite bowel wall thickening or surrounding free air is seen. Colonic diverticula noted without definite diverticulitis. There is question Lymphatic: No enlarged abdominal or pelvic lymph nodes. Reproductive: No mass or other significant abnormality. Other: No abdominal wall hernia or abnormality. No abdominopelvic ascites. Musculoskeletal: No acute or significant osseous findings. Review of the MIP images confirms the above findings IMPRESSION: VASCULAR 1. No acute vascular abnormality. 2. NON-VASCULAR 1. Patchy airspace opacity seen throughout the left lung base. This could be due to atypical viral  infectious etiology. 2. Small air bubbles seen within the left mid abdomen likely within small bowel loops. No definite bowel wall thickening or surrounding inflammatory changes. Not thought to likely represent pneumatosis. However if further evaluation is warranted would recommend repeat CT with oral contrast. 3. Diverticulosis without  definite diverticulitis. These results were called by telephone at the time of interpretation on 01/11/2019 at 10:01 pm to provider [facility], who verbally acknowledged these results. Electronically Signed   By: Jonna Clark M.D.   On: 01/11/2019 22:04    Pending Labs Unresulted Labs (From admission, onward)    Start     Ordered   01/12/19 0128  Urine rapid drug screen (hosp performed)  Add-on,   AD     01/12/19 0127   01/12/19 0000  Lactic acid, plasma  STAT Now then every 3 hours,   STAT     01/11/19 2305   01/11/19 1954  Culture, blood (routine x 2)  BLOOD CULTURE X 2,   STAT     01/11/19 1953   Signed and Held  HIV Antibody (routine testing w rflx)  (HIV Antibody (Routine testing w reflex) panel)  Tomorrow morning,   R     Signed and Held   Signed and Held  HIV4GL Save Tube  (HIV Antibody (Routine testing w reflex) panel)  Once,   R     Signed and Held   Signed and Held  Magnesium  Tomorrow morning,   R    Comments: Call MD if <1.5    Signed and Held   Signed and Held  Phosphorus  Tomorrow morning,   R     Signed and Held   Signed and Held  TSH  Once,   R    Comments: Cancel if already done within 1 month and notify MD    Signed and Held   Signed and Held  Comprehensive metabolic panel  Once,   R    Comments: Cal MD for K<3.5 or >5.0    Signed and Held   Medical illustrator and Held  CBC  Once,   R    Comments: Call for hg <8.0    Signed and Held   Signed and Held  Strep pneumoniae urinary antigen  Once,   R     Signed and Held   Signed and Held  Legionella Pneumophila Serogp 1 Ur Ag  Once,   R     Signed and Held   Signed and Held  Culture, sputum-assessment  Once,   R     Signed and Held          Vitals/Pain Today's Vitals   01/12/19 0237 01/12/19 0330 01/12/19 0400 01/12/19 0429  BP:  104/65 105/63   Pulse:  89 78   Resp:  16 17   Temp:      TempSrc:      SpO2:  95% 96%   Weight:      Height:      PainSc: Asleep   Asleep    Isolation Precautions No active  isolations  Medications Medications  metroNIDAZOLE (FLAGYL) IVPB 500 mg (has no administration in time range)  vancomycin (VANCOCIN) IVPB 750 mg/150 ml premix (0 mg Intravenous Stopped 01/12/19 0045)  ceFEPIme (MAXIPIME) 2 g in sodium chloride 0.9 % 100 mL IVPB (0 g Intravenous Stopped 01/12/19 0114)  sodium chloride flush (NS) 0.9 % injection 3 mL (3 mLs Intravenous Given 01/11/19 2025)  sodium chloride 0.9 % bolus 1,000 mL (  0 mLs Intravenous Stopped 01/11/19 2248)  ondansetron (ZOFRAN) injection 4 mg (4 mg Intravenous Given 01/11/19 2039)  morphine 4 MG/ML injection 4 mg (4 mg Intravenous Given 01/11/19 2042)  acetaminophen (TYLENOL) tablet 1,000 mg (1,000 mg Oral Given 01/11/19 2041)  sodium chloride 0.9 % bolus 1,000 mL (0 mLs Intravenous Stopped 01/11/19 2144)    And  sodium chloride 0.9 % bolus 500 mL (0 mLs Intravenous Stopped 01/11/19 2326)  cefTRIAXone (ROCEPHIN) 2 g in sodium chloride 0.9 % 100 mL IVPB (0 g Intravenous Stopped 01/11/19 2220)  metroNIDAZOLE (FLAGYL) IVPB 500 mg (0 mg Intravenous Stopped 01/11/19 2253)  iohexol (OMNIPAQUE) 350 MG/ML injection 100 mL (100 mLs Intravenous Contrast Given 01/11/19 2123)  doxycycline (VIBRAMYCIN) 100 mg in sodium chloride 0.9 % 250 mL IVPB (0 mg Intravenous Stopped 01/12/19 0114)    Mobility walks Low fall risk   Focused Assessments No pain at this time/respirations unlabored .   R Recommendations: See Admitting Provider Note  Report given to:   Additional Notes:

## 2019-01-12 NOTE — Progress Notes (Signed)
    CC:  bloody bowel movement with emesis and syncope  Subjective: Patient is a Nature conservation officer, and started having left abdominal pain with bowel urgency.  He went to the porta potty and bright red blood; and then passed out.  Patient admitted with pneumonia and abdominal pain.  Pt has significant abdomina pain LLQ.  Objective: Vital signs in last 24 hours: Temp:  [100.9 F (38.3 C)-101 F (38.3 C)] 101 F (38.3 C) (10/07 2220) Pulse Rate:  [71-110] 71 (10/08 0845) Resp:  [13-18] 16 (10/08 0915) BP: (89-132)/(50-75) 108/56 (10/08 0915) SpO2:  [92 %-100 %] 100 % (10/08 0845) Weight:  [77.1 kg] 77.1 kg (10/07 2025)  CT contrast 10/7: Diverticulosis without diverticulitis, small air bubbles seen within the left mid abdomen likely within small bowel loops.  Of bowel wall thickening or surrounding inflammatory changes, not believed to represent pneumatosis Admission temperature 101; non recorded since admit BP down 0200-0400 this AM K+ 3.4, creatinine 1.13 phosphorus 2.0; WBC 20,000 Stool occult is negative Blood cultures pending Respiratory panel: Rhinovirus / Enterovirus DETECTEDAbnormal    COVID negative  Intake/Output from previous day: 10/07 0701 - 10/08 0700 In: 5200 [I.V.:3100; IV Piggyback:2100] Out: 2050 [Urine:2050] Intake/Output this shift: Total I/O In: 100 [IV Piggyback:100] Out: -   General appearance: alert, cooperative and mild distress Resp: clear to auscultation bilaterally GI: very tender LLQ, exam is variable.  No distension.   Lab Results:  Recent Labs    01/11/19 1926 01/12/19 0805  WBC 14.4* 20.0*  HGB 12.2* 10.9*  HCT 37.5* 32.4*  PLT 250 212    BMET Recent Labs    01/11/19 1926 01/12/19 0805  NA 138 140  K 3.8 3.4*  CL 103 108  CO2 24 23  GLUCOSE 99 92  BUN 12 10  CREATININE 1.10 1.03  CALCIUM 9.1 8.2*   PT/INR Recent Labs    01/11/19 2136  LABPROT 14.9  INR 1.2    Recent Labs  Lab 01/11/19 2055 01/12/19 0805   AST 20 16  ALT 19 15  ALKPHOS 48 39  BILITOT 1.4* 1.1  PROT 7.2 5.8*  ALBUMIN 4.2 3.1*     Lipase     Component Value Date/Time   LIPASE 17 01/11/2019 1926   . sodium chloride 125 mL/hr at 01/12/19 0811  . ceFEPime (MAXIPIME) IV Stopped (01/12/19 0905)  . metronidazole Stopped (01/12/19 0654)  . vancomycin Stopped (01/12/19 1050)     Medications:   Assessment/Plan Left community-acquired pneumonia Syncope  Abdominal pain with emesis and BRB per rectum  FEN: IV fluids/n.p.o. ID: Doxycycline 10/7; Rocephin 10/7; Maxipime 10/8>> day 1: Vancomycin 10/8>> day 1:Flagyl 10/7 >> day 2 DVT:  SCD's Follow-up: TBD   Plan:  Repeat CT with contrast ASAP      LOS: 1 day    Shyhiem Beeney 01/12/2019 [phone]

## 2019-01-12 NOTE — ED Notes (Signed)
Pt. Back from CT.

## 2019-01-12 NOTE — ED Notes (Signed)
Dr. Wyline Copas paged to [id number] Jerene Pitch, RN paged by Levada Dy

## 2019-01-13 LAB — COMPREHENSIVE METABOLIC PANEL
ALT: 17 U/L (ref 0–44)
AST: 16 U/L (ref 15–41)
Albumin: 3 g/dL — ABNORMAL LOW (ref 3.5–5.0)
Alkaline Phosphatase: 40 U/L (ref 38–126)
Anion gap: 9 (ref 5–15)
BUN: 9 mg/dL (ref 6–20)
CO2: 20 mmol/L — ABNORMAL LOW (ref 22–32)
Calcium: 8 mg/dL — ABNORMAL LOW (ref 8.9–10.3)
Chloride: 109 mmol/L (ref 98–111)
Creatinine, Ser: 0.93 mg/dL (ref 0.61–1.24)
GFR calc Af Amer: >60 mL/min (ref >60)
GFR calc non Af Amer: >60 mL/min (ref >60)
Glucose, Bld: 82 mg/dL (ref 70–99)
Potassium: 3.4 mmol/L — ABNORMAL LOW (ref 3.5–5.1)
Sodium: 138 mmol/L (ref 135–145)
Total Bilirubin: 0.9 mg/dL (ref 0.3–1.2)
Total Protein: 5.3 g/dL — ABNORMAL LOW (ref 6.5–8.1)

## 2019-01-13 LAB — CBC
HCT: 31.2 % — ABNORMAL LOW (ref 39.0–52.0)
Hemoglobin: 10.3 g/dL — ABNORMAL LOW (ref 13.0–17.0)
MCH: 32.7 pg (ref 26.0–34.0)
MCHC: 33 g/dL (ref 30.0–36.0)
MCV: 99 fL (ref 80.0–100.0)
Platelets: 217 10*3/uL (ref 150–400)
RBC: 3.15 MIL/uL — ABNORMAL LOW (ref 4.22–5.81)
RDW: 11.7 % (ref 11.5–15.5)
WBC: 12.2 10*3/uL — ABNORMAL HIGH (ref 4.0–10.5)
nRBC: 0 % (ref 0.0–0.2)

## 2019-01-13 LAB — MRSA PCR SCREENING: MRSA by PCR: POSITIVE — AB

## 2019-01-13 MED ORDER — SODIUM CHLORIDE 0.9% FLUSH: 10.0000 mL | INTRAVENOUS | Status: DC | PRN

## 2019-01-13 MED ORDER — POTASSIUM CHLORIDE CRYS ER 20 MEQ PO TBCR
40.0000 meq | EXTENDED_RELEASE_TABLET | Freq: Once | ORAL | Status: AC
  Administered 2019-01-13: 09:00:00 40 meq via ORAL
  Filled 2019-01-13: qty 2

## 2019-01-13 MED ORDER — MUPIROCIN 2 % EX OINT
TOPICAL_OINTMENT | Freq: Two times a day (BID) | CUTANEOUS | Status: DC
  Administered 2019-01-13: 1 via NASAL
  Administered 2019-01-13: 14:00:00 via NASAL
  Filled 2019-01-13: qty 22

## 2019-01-13 MED ORDER — SODIUM CHLORIDE 0.9 % IV SOLN
2.0000 g | INTRAVENOUS | Status: DC
  Administered 2019-01-13: 21:00:00 2 g via INTRAVENOUS
  Filled 2019-01-13: qty 20
  Filled 2019-01-13: qty 2

## 2019-01-13 NOTE — Progress Notes (Signed)
PROGRESS NOTE    Antonio Rowland  GUY:[id number] DOB: [birth year] DOA: 01/11/2019 PCP: Patient, No Pcp Per    Brief Narrative:  42yo who presented to ED with complaints of syncopal episode relating to marked abd discomfort. Abd pain noted to be acute in onset with reports of BRBPR. Initial CT abd with findings consistent with PNA as well as possible pneumatosis. General Surgery was consulted. Hospitalist was consulted for consideration for medical admission.  Assessment & Plan:   Active Problems:   Sepsis ([place])   Bright red blood per rectum   CAP (community acquired pneumonia)   Tobacco abuse  Present on Admission:  Sepsis secondary to PNA present on admit  -SIRS criteria met with  elevated white blood cell count,  tachycardia ,fever.   - L sided PNA seen on CXR and again demonstrated on CT abd - CT abd initially with findings worrisome for pneumatosis. F/u CT with contrast noted to have benign abd findings - COVID is neg, rhinovirus positive, urine strep pneumo is pos, MRSA screen pos -Pt has improved on empiric vanc, cefepime, and flagyl -General surgery was initially consulted for concerns of possible pneumatosis, since signed off given benign findings -Lactate of 0.8 -Will narrow abx coverage to rocephin with vancomycin for now. If pt improves in AM, then consider cephalosporin monotherapy to complete course of tx   CAP (community acquired pneumonia) -L sided PNA noted on imaging, personally reviewed -Pt is rhinovirus positive, urine strep pneumo pos, MRSA screen pos -Narrow abx coverage per above   Tobacco abuse -    - cessation done at time of admission  - order nicotine patch   - nursing tobacco cessation protocol. Stable  Abd pain,pneumatosis ruled out -CT abd personally reviewed with findings suggestive of pneumatosis -General Surgery consulted, appreciate input. Follow contrast CT abd confirms benign findings -Surgery has since signed off -Presenting LUQ  abd pain likely related to PNA  Syncope -Possible vasovagal given relation to marked abd pain above -Stable thus far -2d echo reviewed, normal LVEF, unremarkable  DVT prophylaxis: SCD's Code Status: Full Family Communication: Pt in room, family at bedside Disposition Plan: Uncertain at this time  Consultants:   General Surgery  Procedures:     Antimicrobials: Anti-infectives (From admission, onward)   Start     Dose/Rate Route Frequency Ordered Stop   01/13/19 1715  cefTRIAXone (ROCEPHIN) 1 g in sodium chloride 0.9 % 100 mL IVPB     1 g 200 mL/hr over 30 Minutes Intravenous Every 24 hours 01/13/19 1705     01/12/19 0600  metroNIDAZOLE (FLAGYL) IVPB 500 mg  Status:  Discontinued     500 mg 100 mL/hr over 60 Minutes Intravenous Every 8 hours 01/11/19 2305 01/13/19 1705   01/12/19 0000  vancomycin (VANCOCIN) IVPB 750 mg/150 ml premix     750 mg 150 mL/hr over 60 Minutes Intravenous Every 8 hours 01/11/19 2313     01/12/19 0000  ceFEPIme (MAXIPIME) 2 g in sodium chloride 0.9 % 100 mL IVPB  Status:  Discontinued     2 g 200 mL/hr over 30 Minutes Intravenous Every 8 hours 01/11/19 2313 01/13/19 1705   01/11/19 2300  doxycycline (VIBRAMYCIN) 100 mg in sodium chloride 0.9 % 250 mL IVPB     100 mg 125 mL/hr over 120 Minutes Intravenous  Once 01/11/19 2248 01/12/19 0114   01/11/19 2215  azithromycin (ZITHROMAX) 500 mg in sodium chloride 0.9 % 250 mL IVPB  Status:  Discontinued     500  mg 250 mL/hr over 60 Minutes Intravenous  Once 01/11/19 2206 01/11/19 2246   01/11/19 2045  cefTRIAXone (ROCEPHIN) 2 g in sodium chloride 0.9 % 100 mL IVPB     2 g 200 mL/hr over 30 Minutes Intravenous  Once 01/11/19 2037 01/11/19 2220   01/11/19 2045  metroNIDAZOLE (FLAGYL) IVPB 500 mg     500 mg 100 mL/hr over 60 Minutes Intravenous  Once 01/11/19 2037 01/11/19 2253      Subjective: Reports feeling better today  Objective: Vitals:   01/13/19 0400 01/13/19 0434 01/13/19 0751 01/13/19 1254   BP:  (!) 106/57 121/75 111/76  Pulse: 72 76 85 67  Resp: 19 20 (!) 22 17  Temp:  98.6 F (37 C) 98.3 F (36.8 C) 98.8 F (37.1 C)  TempSrc:  Oral Oral Oral  SpO2: 99% 100% 98% 99%  Weight:      Height:        Intake/Output Summary (Last 24 hours) at 01/13/2019 1706 Last data filed at 01/13/2019 1611 Gross per 24 hour  Intake 633.29 ml  Output 1150 ml  Net -516.71 ml   Filed Weights   01/11/19 2025  Weight: 77.1 kg    Examination: General exam: Awake, laying in bed, in nad Respiratory system: Normal respiratory effort, no wheezing Cardiovascular system: regular rate, s1, s2 Gastrointestinal system: Soft, nondistended, positive BS Central nervous system: CN2-12 grossly intact, strength intact Extremities: Perfused, no clubbing Skin: Normal skin turgor, no notable skin lesions seen Psychiatry: Mood normal // no visual hallucinations   Data Reviewed: I have personally reviewed following labs and imaging studies  CBC: Recent Labs  Lab 01/11/19 1926 01/12/19 0805 01/13/19 0324  WBC 14.4* 20.0* 12.2*  HGB 12.2* 10.9* 10.3*  HCT 37.5* 32.4* 31.2*  MCV 100.8* 99.7 99.0  PLT 250 212 790   Basic Metabolic Panel: Recent Labs  Lab 01/11/19 1926 01/12/19 0805 01/13/19 0324  NA 138 140 138  K 3.8 3.4* 3.4*  CL 103 108 109  CO2 24 23 20*  GLUCOSE 99 92 82  BUN 12 10 9   CREATININE 1.10 1.03 0.93  CALCIUM 9.1 8.2* 8.0*  MG  --  1.8  --   PHOS  --  2.0*  --    GFR: Estimated Creatinine Clearance: 106.8 mL/min (by C-G formula based on SCr of 0.93 mg/dL). Liver Function Tests: Recent Labs  Lab 01/11/19 2055 01/12/19 0805 01/13/19 0324  AST 20 16 16   ALT 19 15 17   ALKPHOS 48 39 40  BILITOT 1.4* 1.1 0.9  PROT 7.2 5.8* 5.3*  ALBUMIN 4.2 3.1* 3.0*   Recent Labs  Lab 01/11/19 1926  LIPASE 17   No results for input(s): AMMONIA in the last 168 hours. Coagulation Profile: Recent Labs  Lab 01/11/19 2136  INR 1.2   Cardiac Enzymes: Recent Labs  Lab  01/12/19 0243  CKTOTAL 127   BNP (last 3 results) No results for input(s): PROBNP in the last 8760 hours. HbA1C: No results for input(s): HGBA1C in the last 72 hours. CBG: No results for input(s): GLUCAP in the last 168 hours. Lipid Profile: No results for input(s): CHOL, HDL, LDLCALC, TRIG, CHOLHDL, LDLDIRECT in the last 72 hours. Thyroid Function Tests: No results for input(s): TSH, T4TOTAL, FREET4, T3FREE, THYROIDAB in the last 72 hours. Anemia Panel: No results for input(s): VITAMINB12, FOLATE, FERRITIN, TIBC, IRON, RETICCTPCT in the last 72 hours. Sepsis Labs: Recent Labs  Lab 01/11/19 2020 01/11/19 2251 01/12/19 0000 01/12/19 0012  PROCALCITON  --   --   --  0.81  LATICACIDVEN 1.5 0.9 0.8  --     Recent Results (from the past 240 hour(s))  Culture, blood (routine x 2)     Status: None (Preliminary result)   Collection Time: 01/11/19  8:21 PM   Specimen: BLOOD  Result Value Ref Range Status   Specimen Description BLOOD RIGHT ANTECUBITAL  Final   Special Requests   Final    BOTTLES DRAWN AEROBIC AND ANAEROBIC Blood Culture results may not be optimal due to an excessive volume of blood received in culture bottles   Culture   Final    NO GROWTH 2 DAYS Performed at [facility] [address], [place], [place] [address]    Report Status PENDING  Incomplete  Culture, blood (routine x 2)     Status: None (Preliminary result)   Collection Time: 01/11/19  8:30 PM   Specimen: BLOOD  Result Value Ref Range Status   Specimen Description BLOOD LEFT ANTECUBITAL  Final   Special Requests   Final    BOTTLES DRAWN AEROBIC AND ANAEROBIC Blood Culture results may not be optimal due to an excessive volume of blood received in culture bottles   Culture   Final    NO GROWTH 2 DAYS Performed at [facility], [place] [address] [place],  [address]    Report Status PENDING  Incomplete  SARS Coronavirus 2 [facility] order, Performed in [facility] hospital lab)  Nasopharyngeal Nasopharyngeal Swab     Status: None   Collection Time: 01/11/19  8:30 PM   Specimen: Nasopharyngeal Swab  Result Value Ref Range Status   SARS Coronavirus 2 NEGATIVE NEGATIVE Final    Comment: (NOTE) If result is NEGATIVE SARS-CoV-2 target nucleic acids are NOT DETECTED. The SARS-CoV-2 RNA is generally detectable in upper and lower  respiratory specimens during the acute phase of infection. The lowest  concentration of SARS-CoV-2 viral copies this assay can detect is 250  copies / mL. A negative result does not preclude SARS-CoV-2 infection  and should not be used as the sole basis for treatment or other  patient management decisions.  A negative result may occur with  improper specimen collection / handling, submission of specimen other  than nasopharyngeal swab, presence of viral mutation(s) within the  areas targeted by this assay, and inadequate number of viral copies  (<250 copies / mL). A negative result must be combined with clinical  observations, patient history, and epidemiological information. If result is POSITIVE SARS-CoV-2 target nucleic acids are DETECTED. The SARS-CoV-2 RNA is generally detectable in upper and lower  respiratory specimens dur ing the acute phase of infection.  Positive  results are indicative of active infection with SARS-CoV-2.  Clinical  correlation with patient history and other diagnostic information is  necessary to determine patient infection status.  Positive results do  not rule out bacterial infection or co-infection with other viruses. If result is PRESUMPTIVE POSTIVE SARS-CoV-2 nucleic acids MAY BE PRESENT.   A presumptive positive result was obtained on the submitted specimen  and confirmed on repeat testing.  While 2019 novel coronavirus  (SARS-CoV-2) nucleic acids may be present in the submitted sample  additional confirmatory testing may be necessary for epidemiological  and / or clinical management purposes  to  differentiate between  SARS-CoV-2 and other Sarbecovirus currently known to infect humans.  If clinically indicated additional testing with an alternate test  methodology [phone]) is advised. The SARS-CoV-2 RNA is generally  detectable in upper and lower respiratory  sp ecimens during the acute  phase of infection. The expected result is Negative. Fact Sheet for Patients:  StrictlyIdeas.no Fact Sheet for Healthcare Providers: BankingDealers.co.za This test is not yet approved or cleared by the [place] FDA and has been authorized for detection and/or diagnosis of SARS-CoV-2 by FDA under an Emergency Use Authorization (EUA).  This EUA will remain in effect (meaning this test can be used) for the duration of the COVID-19 declaration under Section 564(b)(1) of the Act, 21 U.S.C. section 360bbb-3(b)(1), unless the authorization is terminated or revoked sooner. Performed at [facility], [place] [address], [place], [place] [address]   Respiratory Panel by PCR     Status: Abnormal   Collection Time: 01/11/19 11:32 PM   Specimen: Nasopharyngeal Swab; Respiratory  Result Value Ref Range Status   Adenovirus NOT DETECTED NOT DETECTED Final   Coronavirus 229E NOT DETECTED NOT DETECTED Final    Comment: (NOTE) The Coronavirus on the Respiratory Panel, DOES NOT test for the novel  Coronavirus (2019 nCoV)    Coronavirus HKU1 NOT DETECTED NOT DETECTED Final   Coronavirus NL63 NOT DETECTED NOT DETECTED Final   Coronavirus OC43 NOT DETECTED NOT DETECTED Final   Metapneumovirus NOT DETECTED NOT DETECTED Final   Rhinovirus / Enterovirus DETECTED (A) NOT DETECTED Final   Influenza A NOT DETECTED NOT DETECTED Final   Influenza B NOT DETECTED NOT DETECTED Final   Parainfluenza Virus 1 NOT DETECTED NOT DETECTED Final   Parainfluenza Virus 2 NOT DETECTED NOT DETECTED Final   Parainfluenza Virus 3 NOT DETECTED NOT DETECTED Final   Parainfluenza  Virus 4 NOT DETECTED NOT DETECTED Final   Respiratory Syncytial Virus NOT DETECTED NOT DETECTED Final   Bordetella pertussis NOT DETECTED NOT DETECTED Final   Chlamydophila pneumoniae NOT DETECTED NOT DETECTED Final   Mycoplasma pneumoniae NOT DETECTED NOT DETECTED Final    Comment: Performed at [facility], [place]. [address] [place], [place] [address]  MRSA PCR Screening     Status: Abnormal   Collection Time: 01/13/19  9:15 AM   Specimen: Nasopharyngeal  Result Value Ref Range Status   MRSA by PCR POSITIVE (A) NEGATIVE Final    Comment:        The GeneXpert MRSA Assay (FDA approved for NASAL specimens only), is one component of a comprehensive MRSA colonization surveillance program. It is not intended to diagnose MRSA infection nor to guide or monitor treatment for MRSA infections. RESULT CALLED TO, READ BACK BY AND VERIFIED WITH: B. Person RN 12:15 01/13/19 ([email or web address]) Performed at [facility], [place] [address], [place], [place] [address]      Radiology Studies: Ct Abdomen Pelvis W Contrast  Result Date: 01/12/2019 CLINICAL DATA:  Acute, diffuse abdominal pain which began in the left abdomen with bowel urgency. This was followed by bright red blood in the stool and then a syncopal episode. The pain is most pronounced in the left lower quadrant of the abdomen currently. COVID-19 sepsis. EXAM: CT ABDOMEN AND PELVIS WITH CONTRAST TECHNIQUE: Multidetector CT imaging of the abdomen and pelvis was performed using the standard protocol following bolus administration of intravenous contrast. CONTRAST:  16m OMNIPAQUE IOHEXOL 300 MG/ML  SOLN COMPARISON:  Abdomen and pelvis CTA obtained yesterday. FINDINGS: Lower chest: Significant increase in patchy opacity in the left lower lobe. Interval small amount of linear atelectasis in the right lower lobe. Hepatobiliary: Mildly heterogeneous liver. Vicariously excreted contrast in the gallbladder. Pancreas: Unremarkable. No pancreatic ductal  dilatation or surrounding  inflammatory changes. Spleen: Normal in size without focal abnormality. Adrenals/Urinary Tract: Adrenal glands are unremarkable. Kidneys are normal, without renal calculi, focal lesion, or hydronephrosis. Bladder is unremarkable. Stomach/Bowel: Stomach is within normal limits. Appendix appears normal. No evidence of bowel wall thickening, distention, or inflammatory changes. No evidence of pneumatosis or diverticulitis. Vascular/Lymphatic: No significant vascular findings are present. No enlarged abdominal or pelvic lymph nodes. Reproductive: Prostate is unremarkable. Other: No abdominal wall hernia or abnormality. No abdominopelvic ascites. Musculoskeletal: Minimal lumbar and lower thoracic spine degenerative changes. IMPRESSION: 1. Significant progression of left lower lobe pneumonia. 2. No abdominopelvic abnormality seen. Electronically Signed   By: Claudie Revering M.D.   On: 01/12/2019 18:05   Dg Chest Port 1 View  Result Date: 01/11/2019 CLINICAL DATA:  Fever, left-sided chest pain EXAM: PORTABLE CHEST 1 VIEW COMPARISON:  None. FINDINGS: The heart size and mediastinal contours are within normal limits. Patchy airspace opacity seen at the left lung base. The visualized skeletal structures are unremarkable. IMPRESSION: Patchy airspace opacity seen at the left lung base. The findings in the lungs are nonspecific, but concerning for atypical infection, which includes viral pneumonia. Electronically Signed   By: Prudencio Pair M.D.   On: 01/11/2019 22:08   Dg Abd Portable 1 View  Addendum Date: 01/11/2019   ADDENDUM REPORT: 01/11/2019 21:51 ADDENDUM: Hazy airspace disease at the left lung base consistent with pneumonia. Electronically Signed   By: Kathreen Devoid   On: 01/11/2019 21:51   Result Date: 01/11/2019 CLINICAL DATA:  Evaluate for free air, perforation EXAM: PORTABLE ABDOMEN - 1 VIEW COMPARISON:  None. FINDINGS: The bowel gas pattern is normal. No pneumoperitoneum. No  radio-opaque calculi or other significant radiographic abnormality are seen. IMPRESSION: No pneumoperitoneum. Electronically Signed: By: Kathreen Devoid On: 01/11/2019 21:33   Ct Angio Abd/pel W And/or Wo Contrast  Result Date: 01/11/2019 CLINICAL DATA:  GI bleed, covid sepsis left lower quadrant abdominal pain to . EXAM: CTA ABDOMEN AND PELVIS wITHOUT AND WITH CONTRAST TECHNIQUE: Multidetector CT imaging of the abdomen and pelvis was performed using the standard protocol during bolus administration of intravenous contrast. Multiplanar reconstructed images and MIPs were obtained and reviewed to evaluate the vascular anatomy. CONTRAST:  125m OMNIPAQUE IOHEXOL 350 MG/ML SOLN COMPARISON:  None. FINDINGS: CTA ABDOMEN AND PELVIS FINDINGS VASCULAR Aorta: Normal caliber aorta without aneurysm, dissection, vasculitis or hemodynamically significant stenosis. There is mild aortic atherosclerosis. Celiac: No aneurysm, dissection or hemodynamically significant stenosis. Normal branching pattern SMA: Widely patent without dissection or stenosis. Renals: Single renal arteries bilaterally. No aneurysm, dissection, stenosis or evidence of fibromuscular dysplasia. IMA: Patent without abnormality. Inflow: No aneurysm, stenosis or dissection. Veins: Normal course and caliber of the major veins. Assessment is otherwise limited by the arterial dominant contrast phase. Review of the MIP images confirms the above findings. Review of the MIP images confirms the above findings. NON-VASCULAR Lower chest: There is patchy airspace opacity seen throughout the left base. Hepatobiliary: No solid liver abnormality is seen. No gallstones, gallbladder wall thickening, or biliary dilatation. Pancreas: Unremarkable. No pancreatic ductal dilatation or surrounding inflammatory changes. Spleen: Normal in size without significant abnormality. Adrenals/Urinary Tract: Adrenal glands are unremarkable. Kidneys are normal, without renal calculi, solid  lesion, or hydronephrosis. Bladder is unremarkable. Stomach/Bowel: The stomach appears to be grossly unremarkable. There small air bubbles seen within small bowel in the left mid abdomen. No definite bowel wall thickening or surrounding free air is seen. Colonic diverticula noted without definite diverticulitis. There is question Lymphatic: No enlarged  abdominal or pelvic lymph nodes. Reproductive: No mass or other significant abnormality. Other: No abdominal wall hernia or abnormality. No abdominopelvic ascites. Musculoskeletal: No acute or significant osseous findings. Review of the MIP images confirms the above findings IMPRESSION: VASCULAR 1. No acute vascular abnormality. 2. NON-VASCULAR 1. Patchy airspace opacity seen throughout the left lung base. This could be due to atypical viral infectious etiology. 2. Small air bubbles seen within the left mid abdomen likely within small bowel loops. No definite bowel wall thickening or surrounding inflammatory changes. Not thought to likely represent pneumatosis. However if further evaluation is warranted would recommend repeat CT with oral contrast. 3. Diverticulosis without definite diverticulitis. These results were called by telephone at the time of interpretation on 01/11/2019 at 10:01 pm to provider [facility], [facility], who verbally acknowledged these results. Electronically Signed   By: Prudencio Pair M.D.   On: 01/11/2019 22:04    Scheduled Meds:  mupirocin ointment   Nasal BID   Continuous Infusions:  cefTRIAXone (ROCEPHIN)  IV     vancomycin 750 mg (01/13/19 1222)     LOS: 2 days   Marylu Lund, MD [facility] Pager On Amion  If 7PM-7AM, please contact night-coverage 01/13/2019, 5:06 PM

## 2019-01-13 NOTE — Progress Notes (Signed)
Pt transferred from ED to 4E 13, appeared alert and oriented x 4, left lower quadrant abdominal pain scale 8/10, able to walk with one assistance. Vital signs stable. Sinus rhythm on monitor.   GHG bath given, call bel with in reach, room and equipments instruction given. CCMD called with 2nd verified person. Skin assessment done with 2nd person,[place], RN. Skin intact. Will continue to monitor  Kennyth Lose, RN.

## 2019-01-13 NOTE — ED Notes (Signed)
ED TO INPATIENT HANDOFF REPORT  ED Nurse Name and Phone #: [phone]  S Name/Age/Gender Antonio MedianNicholas Rowland 43 y.o. male Room/Bed: 017C/017C  Code Status   Code Status: Full Code  Home/SNF/Other Home Patient oriented to: self, place, time and situation Is this baseline? Yes   Triage Complete: Triage complete  Chief Complaint syncope blood in stool stomach pain  Triage Note Patient states that he had bloody bowel movement today with 1 episode of vomiting and than reports syncopal episode. Patient alert and oriented, complains of ongoing generalized abdominal pain   Allergies Allergies  Allergen Reactions  . Azithromycin     hives  . Norco [Hydrocodone-Acetaminophen]     Weakness  . Ritalin [Methylphenidate]     swelling    Level of Care/Admitting Diagnosis ED Disposition    ED Disposition Condition Comment   Admit  [facility] [[id number]]  Level of Care: Progressive [102]  Covid Evaluation: Confirmed COVID Negative  Diagnosis: Sepsis [facility](HCC) [[id number]]) [[id number]]  Admitting Physician: Therisa DoyneUTOVA, ANASTASSIA [[id number]]  Attending Physician: Therisa DoyneUTOVA, ANASTASSIA [[id number]]  Estimated length of stay: 3 - 4 days  Certification:: I certify this patient will need inpatient services for at least 2 midnights  PT Class (Do Not Modify): Inpatient [101]  PT Acc Code (Do Not Modify): Private [1]       B Medical/Surgery History History reviewed. No pertinent past medical history. History reviewed. No pertinent surgical history.   A IV Location/Drains/Wounds Patient Lines/Drains/Airways Status   Active Line/Drains/Airways    Name:   Placement date:   Placement time:   Site:   Days:   Peripheral IV 01/11/19 Right Antecubital   01/11/19    2020    Antecubital   2          Intake/Output Last 24 hours  Intake/Output Summary (Last 24 hours) at 01/13/2019 0021 Last data filed at 01/12/2019 1915 Gross per 24 hour  Intake 3500 ml  Output 1600 ml  Net 1900 ml     Labs/Imaging Results for orders placed or performed during the hospital encounter of 01/11/19 (from the past 48 hour(s))  Basic metabolic panel     Status: None   Collection Time: 01/11/19  7:26 PM  Result Value Ref Range   Sodium 138 135 - 145 mmol/L   Potassium 3.8 3.5 - 5.1 mmol/L   Chloride 103 98 - 111 mmol/L   CO2 24 22 - 32 mmol/L   Glucose, Bld 99 70 - 99 mg/dL   BUN 12 6 - 20 mg/dL   Creatinine, Ser 4.781.10 0.61 - 1.24 mg/dL   Calcium 9.1 8.9 - 29.510.3 mg/dL   GFR calc non Af Amer >60 >60 mL/min   GFR calc Af Amer >60 >60 mL/min   Anion gap 11 5 - 15    Comment: Performed at [facility], [address] [place], [place] [address]  CBC     Status: Abnormal   Collection Time: 01/11/19  7:26 PM  Result Value Ref Range   WBC 14.4 (H) 4.0 - 10.5 K/uL   RBC 3.72 (L) 4.22 - 5.81 MIL/uL   Hemoglobin 12.2 (L) 13.0 - 17.0 g/dL   HCT 86.537.5 (L) 78.439.0 - 69.652.0 %   MCV 100.8 (H) 80.0 - 100.0 fL   MCH 32.8 26.0 - 34.0 pg   MCHC 32.5 30.0 - 36.0 g/dL   RDW 29.511.6 28.411.5 - 13.215.5 %   Platelets 250 150 - 400 K/uL   nRBC 0.0 0.0 -  0.2 %    Comment: Performed at [facility], [address], [place], [place] [address]  Lipase, blood     Status: None   Collection Time: 01/11/19  7:26 PM  Result Value Ref Range   Lipase 17 11 - 51 U/L    Comment: Performed at [facility], [address], [place], [place] [address]  Lactic acid, plasma     Status: None   Collection Time: 01/11/19  8:20 PM  Result Value Ref Range   Lactic Acid, Venous 1.5 0.5 - 1.9 mmol/L    Comment: Performed at [facility], [address] [place], [place] [address]  Type and screen [facility]     Status: None   Collection Time: 01/11/19  8:21 PM  Result Value Ref Range   ABO/RH(D) O POS    Antibody Screen NEG    Sample Expiration      01/14/2019,2359 Performed at [facility], [address] [place], [place] [address]   Urinalysis, Routine w reflex microscopic      Status: Abnormal   Collection Time: 01/11/19  8:21 PM  Result Value Ref Range   Color, Urine YELLOW YELLOW   APPearance CLEAR CLEAR   Specific Gravity, Urine 1.029 1.005 - 1.030   pH 5.0 5.0 - 8.0   Glucose, UA NEGATIVE NEGATIVE mg/dL   Hgb urine dipstick SMALL (A) NEGATIVE   Bilirubin Urine NEGATIVE NEGATIVE   Ketones, ur 20 (A) NEGATIVE mg/dL   Protein, ur NEGATIVE NEGATIVE mg/dL   Nitrite NEGATIVE NEGATIVE   Leukocytes,Ua NEGATIVE NEGATIVE   RBC / HPF 0-5 0 - 5 RBC/hpf   WBC, UA 0-5 0 - 5 WBC/hpf   Bacteria, UA NONE SEEN NONE SEEN   Mucus PRESENT     Comment: Performed at [facility], [address], [place], [place] [address]  Culture, blood (routine x 2)     Status: None (Preliminary result)   Collection Time: 01/11/19  8:21 PM   Specimen: BLOOD  Result Value Ref Range   Specimen Description BLOOD RIGHT ANTECUBITAL    Special Requests      BOTTLES DRAWN AEROBIC AND ANAEROBIC Blood Culture results may not be optimal due to an excessive volume of blood received in culture bottles   Culture      NO GROWTH < 24 HOURS Performed at [facility], [address] [place], [place] [address]    Report Status PENDING   ABO/Rh     Status: None   Collection Time: 01/11/19  8:21 PM  Result Value Ref Range   ABO/RH(D)      O POS Performed at [facility], [address] [place], [place] [address]   Urine rapid drug screen (hosp performed)     Status: Abnormal   Collection Time: 01/11/19  8:21 PM  Result Value Ref Range   Opiates POSITIVE (A) NONE DETECTED   Cocaine NONE DETECTED NONE DETECTED   Benzodiazepines NONE DETECTED NONE DETECTED   Amphetamines NONE DETECTED NONE DETECTED   Tetrahydrocannabinol POSITIVE (A) NONE DETECTED   Barbiturates NONE DETECTED NONE DETECTED    Comment: (NOTE) DRUG SCREEN FOR MEDICAL PURPOSES ONLY.  IF CONFIRMATION IS NEEDED FOR ANY PURPOSE, NOTIFY LAB WITHIN 5 DAYS. LOWEST DETECTABLE LIMITS FOR URINE DRUG SCREEN Drug Class                      Cutoff (ng/mL)  Amphetamine and metabolites    1000 Barbiturate and metabolites    200 Benzodiazepine                 101 Tricyclics and metabolites     300 Opiates and metabolites        300 Cocaine and metabolites        300 THC                            50 Performed at [facility], [address] [place], [place] [address]   Culture, blood (routine x 2)     Status: None (Preliminary result)   Collection Time: 01/11/19  8:30 PM   Specimen: BLOOD  Result Value Ref Range   Specimen Description BLOOD LEFT ANTECUBITAL    Special Requests      BOTTLES DRAWN AEROBIC AND ANAEROBIC Blood Culture results may not be optimal due to an excessive volume of blood received in culture bottles   Culture      NO GROWTH < 24 HOURS Performed at [place] [address], [place], [place] [address]    Report Status PENDING   SARS Coronavirus 2 [facility] order, Performed in [facility] hospital lab) Nasopharyngeal Nasopharyngeal Swab     Status: None   Collection Time: 01/11/19  8:30 PM   Specimen: Nasopharyngeal Swab  Result Value Ref Range   SARS Coronavirus 2 NEGATIVE NEGATIVE    Comment: (NOTE) If result is NEGATIVE SARS-CoV-2 target nucleic acids are NOT DETECTED. The SARS-CoV-2 RNA is generally detectable in upper and lower  respiratory specimens during the acute phase of infection. The lowest  concentration of SARS-CoV-2 viral copies this assay can detect is 250  copies / mL. A negative result does not preclude SARS-CoV-2 infection  and should not be used as the sole basis for treatment or other  patient management decisions.  A negative result may occur with  improper specimen collection / handling, submission of specimen other  than nasopharyngeal swab, presence of viral mutation(s) within the  areas targeted by this assay, and inadequate number of viral copies  (<250 copies / mL). A negative result must be combined with clinical  observations,  patient history, and epidemiological information. If result is POSITIVE SARS-CoV-2 target nucleic acids are DETECTED. The SARS-CoV-2 RNA is generally detectable in upper and lower  respiratory specimens dur ing the acute phase of infection.  Positive  results are indicative of active infection with SARS-CoV-2.  Clinical  correlation with patient history and other diagnostic information is  necessary to determine patient infection status.  Positive results do  not rule out bacterial infection or co-infection with other viruses. If result is PRESUMPTIVE POSTIVE SARS-CoV-2 nucleic acids MAY BE PRESENT.   A presumptive positive result was obtained on the submitted specimen  and confirmed on repeat testing.  While 2019 novel coronavirus  (SARS-CoV-2) nucleic acids may be present in the submitted sample  additional confirmatory testing may be necessary for epidemiological  and / or clinical management purposes  to differentiate between  SARS-CoV-2 and other Sarbecovirus currently known to infect humans.  If clinically indicated additional testing with an alternate test  methodology [phone]) is advised. The SARS-CoV-2 RNA is generally  detectable in upper and lower respiratory sp ecimens during the acute  phase of infection. The expected result is Negative. Fact Sheet for Patients:  StrictlyIdeas.no Fact Sheet for Healthcare Providers: BankingDealers.co.za This test is  not yet approved or cleared by the [place] and has been authorized for detection and/or diagnosis of SARS-CoV-2 by FDA under an Emergency Use Authorization (EUA).  This EUA will remain in effect (meaning this test can be used) for the duration of the COVID-19 declaration under Section 564(b)(1) of the Act, 21 U.S.C. section 360bbb-3(b)(1), unless the authorization is terminated or revoked sooner. Performed at [facility], [address] [place],  [place] [address]   Hepatic function panel     Status: Abnormal   Collection Time: 01/11/19  8:55 PM  Result Value Ref Range   Total Protein 7.2 6.5 - 8.1 g/dL   Albumin 4.2 3.5 - 5.0 g/dL   AST 20 15 - 41 U/L   ALT 19 0 - 44 U/L   Alkaline Phosphatase 48 38 - 126 U/L   Total Bilirubin 1.4 (H) 0.3 - 1.2 mg/dL   Bilirubin, Direct 0.1 0.0 - 0.2 mg/dL   Indirect Bilirubin 1.3 (H) 0.3 - 0.9 mg/dL    Comment: Performed at [facility], [address] [place], [place] [address]  POC occult blood, ED     Status: None   Collection Time: 01/11/19  8:59 PM  Result Value Ref Range   Fecal Occult Bld NEGATIVE NEGATIVE  Protime-INR     Status: None   Collection Time: 01/11/19  9:36 PM  Result Value Ref Range   Prothrombin Time 14.9 11.4 - 15.2 seconds   INR 1.2 0.8 - 1.2    Comment: (NOTE) INR goal varies based on device and disease states. Performed at [facility], [address], [place], [place] [address]   APTT     Status: None   Collection Time: 01/11/19  9:36 PM  Result Value Ref Range   aPTT 33 24 - 36 seconds    Comment: Performed at [facility], [address], [place], [place] [address]  Lactic acid, plasma     Status: None   Collection Time: 01/11/19 10:51 PM  Result Value Ref Range   Lactic Acid, Venous 0.9 0.5 - 1.9 mmol/L    Comment: Performed at [facility], [address] [place], [place] [address]  Respiratory Panel by PCR     Status: Abnormal   Collection Time: 01/11/19 11:32 PM   Specimen: Nasopharyngeal Swab; Respiratory  Result Value Ref Range   Adenovirus NOT DETECTED NOT DETECTED   Coronavirus 229E NOT DETECTED NOT DETECTED    Comment: (NOTE) The Coronavirus on the Respiratory Panel, DOES NOT test for the novel  Coronavirus (2019 nCoV)    Coronavirus HKU1 NOT DETECTED NOT DETECTED   Coronavirus NL63 NOT DETECTED NOT DETECTED   Coronavirus OC43 NOT DETECTED NOT DETECTED   Metapneumovirus NOT DETECTED NOT DETECTED   Rhinovirus /  Enterovirus DETECTED (A) NOT DETECTED   Influenza A NOT DETECTED NOT DETECTED   Influenza B NOT DETECTED NOT DETECTED   Parainfluenza Virus 1 NOT DETECTED NOT DETECTED   Parainfluenza Virus 2 NOT DETECTED NOT DETECTED   Parainfluenza Virus 3 NOT DETECTED NOT DETECTED   Parainfluenza Virus 4 NOT DETECTED NOT DETECTED   Respiratory Syncytial Virus NOT DETECTED NOT DETECTED   Bordetella pertussis NOT DETECTED NOT DETECTED   Chlamydophila pneumoniae NOT DETECTED NOT DETECTED   Mycoplasma pneumoniae NOT DETECTED NOT DETECTED    Comment: Performed at [facility], [address], [place], [place] [address]  Lactic acid, plasma  Status: None   Collection Time: 01/12/19 12:00 AM  Result Value Ref Range   Lactic Acid, Venous 0.8 0.5 - 1.9 mmol/L    Comment: Performed at [facility], [address] [place], [place] [address]  Procalcitonin     Status: None   Collection Time: 01/12/19 12:12 AM  Result Value Ref Range   Procalcitonin 0.81 ng/mL    Comment:        Interpretation: PCT > 0.5 ng/mL and <= 2 ng/mL: Systemic infection (sepsis) is possible, but other conditions are known to elevate PCT as well. (NOTE)       Sepsis PCT Algorithm           Lower Respiratory Tract                                      Infection PCT Algorithm    ----------------------------     ----------------------------         PCT < 0.25 ng/mL                PCT < 0.10 ng/mL         Strongly encourage             Strongly discourage   discontinuation of antibiotics    initiation of antibiotics    ----------------------------     -----------------------------       PCT 0.25 - 0.50 ng/mL            PCT 0.10 - 0.25 ng/mL               OR       >80% decrease in PCT            Discourage initiation of                                            antibiotics      Encourage discontinuation           of antibiotics    ----------------------------     -----------------------------         PCT >= 0.50  ng/mL              PCT 0.26 - 0.50 ng/mL                AND       <80% decrease in PCT             Encourage initiation of                                             antibiotics       Encourage continuation           of antibiotics    ----------------------------     -----------------------------        PCT >= 0.50 ng/mL                  PCT > 0.50 ng/mL               AND         increase in PCT  Strongly encourage                                      initiation of antibiotics    Strongly encourage escalation           of antibiotics                                     -----------------------------                                           PCT <= 0.25 ng/mL                                                 OR                                        > 80% decrease in PCT                                     Discontinue / Do not initiate                                             antibiotics Performed at [facility], [address], [place], [place] [address]   D-dimer, quantitative (not at [facility])     Status: None   Collection Time: 01/12/19  2:43 AM  Result Value Ref Range   D-Dimer, Quant 0.41 0.00 - 0.50 ug/mL-FEU    Comment: (NOTE) At the manufacturer cut-off of 0.50 ug/mL FEU, this assay has been documented to exclude PE with a sensitivity and negative predictive value of 97 to 99%.  At this time, this assay has not been approved by the FDA to exclude DVT/VTE. Results should be correlated with clinical presentation. Performed at [facility], [address] [place], [place] [address]   CK     Status: None   Collection Time: 01/12/19  2:43 AM  Result Value Ref Range   Total CK 127 49 - 397 U/L    Comment: Performed at [facility], [address], [place], [place] [address]  Strep pneumoniae urinary antigen     Status: Abnormal   Collection Time: 01/12/19  7:45 AM  Result Value Ref Range   Strep Pneumo Urinary Antigen POSITIVE (A)  NEGATIVE    Comment: Performed at [facility], [address] [place], [place] [address]  HIV Antibody (routine testing w rflx)     Status: None   Collection Time: 01/12/19  8:05 AM  Result Value Ref Range   HIV Screen 4th Generation wRfx NON REACTIVE NON REACTIVE    Comment: Performed at [facility], [address] [place], [place] [address]  Magnesium     Status: None  Collection Time: 01/12/19  8:05 AM  Result Value Ref Range   Magnesium 1.8 1.7 - 2.4 mg/dL    Comment: Performed at [facility], [address], [place], [place] [address]  Phosphorus     Status: Abnormal   Collection Time: 01/12/19  8:05 AM  Result Value Ref Range   Phosphorus 2.0 (L) 2.5 - 4.6 mg/dL    Comment: Performed at [facility], [address], [place], [place] [address]  Comprehensive metabolic panel     Status: Abnormal   Collection Time: 01/12/19  8:05 AM  Result Value Ref Range   Sodium 140 135 - 145 mmol/L   Potassium 3.4 (L) 3.5 - 5.1 mmol/L   Chloride 108 98 - 111 mmol/L   CO2 23 22 - 32 mmol/L   Glucose, Bld 92 70 - 99 mg/dL   BUN 10 6 - 20 mg/dL   Creatinine, Ser 5.42 0.61 - 1.24 mg/dL   Calcium 8.2 (L) 8.9 - 10.3 mg/dL   Total Protein 5.8 (L) 6.5 - 8.1 g/dL   Albumin 3.1 (L) 3.5 - 5.0 g/dL   AST 16 15 - 41 U/L   ALT 15 0 - 44 U/L   Alkaline Phosphatase 39 38 - 126 U/L   Total Bilirubin 1.1 0.3 - 1.2 mg/dL   GFR calc non Af Amer >60 >60 mL/min   GFR calc Af Amer >60 >60 mL/min   Anion gap 9 5 - 15    Comment: Performed at [facility], [address] [place], [place] [address]  CBC     Status: Abnormal   Collection Time: 01/12/19  8:05 AM  Result Value Ref Range   WBC 20.0 (H) 4.0 - 10.5 K/uL   RBC 3.25 (L) 4.22 - 5.81 MIL/uL   Hemoglobin 10.9 (L) 13.0 - 17.0 g/dL   HCT 76.2 (L) 83.1 - 51.7 %   MCV 99.7 80.0 - 100.0 fL   MCH 33.5 26.0 - 34.0 pg   MCHC 33.6 30.0 - 36.0 g/dL   RDW 61.6 07.3 - 71.0 %   Platelets 212 150 - 400 K/uL   nRBC 0.0 0.0 -  0.2 %    Comment: Performed at [facility], [address], [place], [place] [address]   Ct Abdomen Pelvis W Contrast  Result Date: 01/12/2019 CLINICAL DATA:  Acute, diffuse abdominal pain which began in the left abdomen with bowel urgency. This was followed by bright red blood in the stool and then a syncopal episode. The pain is most pronounced in the left lower quadrant of the abdomen currently. COVID-19 sepsis. EXAM: CT ABDOMEN AND PELVIS WITH CONTRAST TECHNIQUE: Multidetector CT imaging of the abdomen and pelvis was performed using the standard protocol following bolus administration of intravenous contrast. CONTRAST:  31mL OMNIPAQUE IOHEXOL 300 MG/ML  SOLN COMPARISON:  Abdomen and pelvis CTA obtained yesterday. FINDINGS: Lower chest: Significant increase in patchy opacity in the left lower lobe. Interval small amount of linear atelectasis in the right lower lobe. Hepatobiliary: Mildly heterogeneous liver. Vicariously excreted contrast in the gallbladder. Pancreas: Unremarkable. No pancreatic ductal dilatation or surrounding inflammatory changes. Spleen: Normal in size without focal abnormality. Adrenals/Urinary Tract: Adrenal glands are unremarkable. Kidneys are normal, without renal calculi, focal lesion, or hydronephrosis. Bladder is unremarkable. Stomach/Bowel: Stomach is within normal limits. Appendix appears normal. No evidence of bowel wall thickening, distention, or inflammatory changes. No evidence of pneumatosis or diverticulitis. Vascular/Lymphatic: No significant vascular findings are present. No  enlarged abdominal or pelvic lymph nodes. Reproductive: Prostate is unremarkable. Other: No abdominal wall hernia or abnormality. No abdominopelvic ascites. Musculoskeletal: Minimal lumbar and lower thoracic spine degenerative changes. IMPRESSION: 1. Significant progression of left lower lobe pneumonia. 2. No abdominopelvic abnormality seen. Electronically Signed   By: Beckie Salts M.D.   On:  01/12/2019 18:05   Dg Chest Port 1 View  Result Date: 01/11/2019 CLINICAL DATA:  Fever, left-sided chest pain EXAM: PORTABLE CHEST 1 VIEW COMPARISON:  None. FINDINGS: The heart size and mediastinal contours are within normal limits. Patchy airspace opacity seen at the left lung base. The visualized skeletal structures are unremarkable. IMPRESSION: Patchy airspace opacity seen at the left lung base. The findings in the lungs are nonspecific, but concerning for atypical infection, which includes viral pneumonia. Electronically Signed   By: Jonna Clark M.D.   On: 01/11/2019 22:08   Dg Abd Portable 1 View  Addendum Date: 01/11/2019   ADDENDUM REPORT: 01/11/2019 21:51 ADDENDUM: Hazy airspace disease at the left lung base consistent with pneumonia. Electronically Signed   By: Elige Ko   On: 01/11/2019 21:51   Result Date: 01/11/2019 CLINICAL DATA:  Evaluate for free air, perforation EXAM: PORTABLE ABDOMEN - 1 VIEW COMPARISON:  None. FINDINGS: The bowel gas pattern is normal. No pneumoperitoneum. No radio-opaque calculi or other significant radiographic abnormality are seen. IMPRESSION: No pneumoperitoneum. Electronically Signed: By: Elige Ko On: 01/11/2019 21:33   Ct Angio Abd/pel W And/or Wo Contrast  Result Date: 01/11/2019 CLINICAL DATA:  GI bleed, covid sepsis left lower quadrant abdominal pain to . EXAM: CTA ABDOMEN AND PELVIS wITHOUT AND WITH CONTRAST TECHNIQUE: Multidetector CT imaging of the abdomen and pelvis was performed using the standard protocol during bolus administration of intravenous contrast. Multiplanar reconstructed images and MIPs were obtained and reviewed to evaluate the vascular anatomy. CONTRAST:  OMNIPAQUE IOHEXOL 350 MG/ML SOLN COMPARISON:  None. FINDINGS: CTA ABDOMEN AND PELVIS FINDINGS VASCULAR Aorta: Normal caliber aorta without aneurysm, dissection, vasculitis or hemodynamically significant stenosis. There is mild aortic atherosclerosis. Celiac: No aneurysm,  dissection or hemodynamically significant stenosis. Normal branching pattern SMA: Widely patent without dissection or stenosis. Renals: Single renal arteries bilaterally. No aneurysm, dissection, stenosis or evidence of fibromuscular dysplasia. IMA: Patent without abnormality. Inflow: No aneurysm, stenosis or dissection. Veins: Normal course and caliber of the major veins. Assessment is otherwise limited by the arterial dominant contrast phase. Review of the MIP images confirms the above findings. Review of the MIP images confirms the above findings. NON-VASCULAR Lower chest: There is patchy airspace opacity seen throughout the left base. Hepatobiliary: No solid liver abnormality is seen. No gallstones, gallbladder wall thickening, or biliary dilatation. Pancreas: Unremarkable. No pancreatic ductal dilatation or surrounding inflammatory changes. Spleen: Normal in size without significant abnormality. Adrenals/Urinary Tract: Adrenal glands are unremarkable. Kidneys are normal, without renal calculi, solid lesion, or hydronephrosis. Bladder is unremarkable. Stomach/Bowel: The stomach appears to be grossly unremarkable. There small air bubbles seen within small bowel in the left mid abdomen. No definite bowel wall thickening or surrounding free air is seen. Colonic diverticula noted without definite diverticulitis. There is question Lymphatic: No enlarged abdominal or pelvic lymph nodes. Reproductive: No mass or other significant abnormality. Other: No abdominal wall hernia or abnormality. No abdominopelvic ascites. Musculoskeletal: No acute or significant osseous findings. Review of the MIP images confirms the above findings IMPRESSION: VASCULAR 1. No acute vascular abnormality. 2. NON-VASCULAR 1. Patchy airspace opacity seen throughout the left lung base. This could be due  to atypical viral infectious etiology. 2. Small air bubbles seen within the left mid abdomen likely within small bowel loops. No definite bowel  wall thickening or surrounding inflammatory changes. Not thought to likely represent pneumatosis. However if further evaluation is warranted would recommend repeat CT with oral contrast. 3. Diverticulosis without definite diverticulitis. These results were called by telephone at the time of interpretation on 01/11/2019 at 10:01 pm to provider [facility], who verbally acknowledged these results. Electronically Signed   By: Jonna Clark M.D.   On: 01/11/2019 22:04    Pending Labs Unresulted Labs (From admission, onward)    Start     Ordered   01/13/19 0500  Comprehensive metabolic panel  Tomorrow morning,   R     01/12/19 1917   01/13/19 0500  CBC  Tomorrow morning,   R     01/12/19 1917   01/12/19 0745  HIV4GL Save Tube  (HIV Antibody (Routine testing w reflex) panel)  Once,   STAT     01/12/19 0744   01/12/19 0745  TSH  Once,   STAT    Comments: Cancel if already done within 1 month and notify MD    01/12/19 0744   01/12/19 0745  Legionella Pneumophila Serogp 1 Ur Ag  Once,   STAT     01/12/19 0744   01/12/19 0745  Culture, sputum-assessment  Once,   R     01/12/19 0744   01/12/19 0000  Lactic acid, plasma  STAT Now then every 3 hours,   STAT     01/11/19 2305          Vitals/Pain Today's Vitals   01/12/19 2234 01/12/19 2345 01/13/19 0004 01/13/19 0015  BP:      Pulse:  80  86  Resp:  15  15  Temp:      TempSrc:      SpO2:  96%  98%  Weight:      Height:      PainSc: 0-No pain  0-No pain     Isolation Precautions No active isolations  Medications Medications  metroNIDAZOLE (FLAGYL) IVPB 500 mg (0 mg Intravenous Stopped 01/12/19 2316)  vancomycin (VANCOCIN) IVPB 750 mg/150 ml premix (0 mg Intravenous Stopped 01/12/19 1917)  ceFEPIme (MAXIPIME) 2 g in sodium chloride 0.9 % 100 mL IVPB (0 g Intravenous Stopped 01/13/19 0003)  acetaminophen (TYLENOL) tablet 650 mg (has no administration in time range)    Or  acetaminophen (TYLENOL) suppository 650 mg (has no  administration in time range)  ondansetron (ZOFRAN) tablet 4 mg (has no administration in time range)    Or  ondansetron (ZOFRAN) injection 4 mg (has no administration in time range)  0.9 %  sodium chloride infusion ( Intravenous Stopped 01/12/19 1917)  sodium chloride flush (NS) 0.9 % injection 3 mL (3 mLs Intravenous Given 01/11/19 2025)  sodium chloride 0.9 % bolus 1,000 mL (0 mLs Intravenous Stopped 01/11/19 2248)  ondansetron (ZOFRAN) injection 4 mg (4 mg Intravenous Given 01/11/19 2039)  morphine 4 MG/ML injection 4 mg (4 mg Intravenous Given 01/11/19 2042)  acetaminophen (TYLENOL) tablet 1,000 mg (1,000 mg Oral Given 01/11/19 2041)  sodium chloride 0.9 % bolus 1,000 mL (0 mLs Intravenous Stopped 01/11/19 2144)    And  sodium chloride 0.9 % bolus 500 mL (0 mLs Intravenous Stopped 01/11/19 2326)  cefTRIAXone (ROCEPHIN) 2 g in sodium chloride 0.9 % 100 mL IVPB (0 g Intravenous Stopped 01/11/19 2220)  metroNIDAZOLE (FLAGYL) IVPB 500 mg (0  mg Intravenous Stopped 01/11/19 2253)  iohexol (OMNIPAQUE) 350 MG/ML injection 100 mL (100 mLs Intravenous Contrast Given 01/11/19 2123)  doxycycline (VIBRAMYCIN) 100 mg in sodium chloride 0.9 % 250 mL IVPB (0 mg Intravenous Stopped 01/12/19 0114)  sodium chloride 0.9 % bolus 1,000 mL (0 mLs Intravenous Stopped 01/12/19 1543)  iohexol (OMNIPAQUE) 300 MG/ML solution 100 mL (80 mLs Intravenous Contrast Given 01/12/19 1326)    Mobility walks with person assist Moderate fall risk   Focused Assessments Pulmonary Assessment Handoff:  Lung sounds: Bilateral Breath Sounds: Diminished L Breath Sounds: Diminished R Breath Sounds: Clear O2 Device: Room Air        R Recommendations: See Admitting Provider Note  Report given to:   Additional Notes: -

## 2019-01-13 NOTE — Progress Notes (Signed)
    CC: Left chest and abdominal pain  Subjective: Abdominal pain better, still sore on the left side.  Tolerating liquid diet well.    Objective: Vital signs in last 24 hours: Temp:  [98 F (36.7 C)-98.8 F (37.1 C)] 98.3 F (36.8 C) (10/09 0751) Pulse Rate:  [61-98] 85 (10/09 0751) Resp:  [11-24] 22 (10/09 0751) BP: (102-131)/(56-82) 121/75 (10/09 0751) SpO2:  [96 %-100 %] 98 % (10/09 0751) Last BM Date: 01/12/19 8083 IV recorded Nothing p.o. recorded Urine 250 Afebrile vital signs are stable K+ 3.4 wBC 20.0>>12.2 Strep Pneumo Urinary Antigen NEGATIVE POSITIVEAbnormal    Repeat CT of the abdomen pelvis with contrast 10/8: Showed a significant increase in the patchy opacity in the left lower lobe, interval small amounts of linear atelectasis in the right lobe.  There was no abdominal pelvic abnormality. Fecal occult is negative Hemoglobin hematocrit are stable.  Intake/Output from previous day: 10/08 0701 - 10/09 0700 In: 883.3 [IV Piggyback:883.3] Out: 250 [Urine:250] Intake/Output this shift: No intake/output data recorded.  General appearance: alert, cooperative and no distress GI: soft, still sore on the left side, but much better.  tolerating full liquids well  Lab Results:  Recent Labs    01/12/19 0805 01/13/19 0324  WBC 20.0* 12.2*  HGB 10.9* 10.3*  HCT 32.4* 31.2*  PLT 212 217    BMET Recent Labs    01/12/19 0805 01/13/19 0324  NA 140 138  K 3.4* 3.4*  CL 108 109  CO2 23 20*  GLUCOSE 92 82  BUN 10 9  CREATININE 1.03 0.93  CALCIUM 8.2* 8.0*   PT/INR Recent Labs    01/11/19 2136  LABPROT 14.9  INR 1.2    Recent Labs  Lab 01/11/19 2055 01/12/19 0805 01/13/19 0324  AST 20 16 16   ALT 19 15 17   ALKPHOS 48 39 40  BILITOT 1.4* 1.1 0.9  PROT 7.2 5.8* 5.3*  ALBUMIN 4.2 3.1* 3.0*     Lipase     Component Value Date/Time   LIPASE 17 01/11/2019 1926     Medications: . potassium chloride  40 mEq Oral Once   . ceFEPime  (MAXIPIME) IV 2 g (01/13/19 0754)  . metronidazole 500 mg (01/13/19 0543)  . vancomycin Stopped (01/13/19 0224)    Assessment/Plan Left community-acquired pneumonia Syncope  Abdominal pain with emesis and BRB per rectum  FEN: IV fluids/full liquids ID: Doxycycline 10/7; Rocephin 10/7; Maxipime 10/8>> day 2: Vancomycin 10/8>> day 2:Flagyl 10/7 >> day 3 DVT:  SCD's Follow-up: TBD   Plan:  No surgical issues, you can advance diet from our standpoint.  We will see again as needed.  Please feel free to call.       LOS: 2 days    Lexey Fletes 01/13/2019 [phone]

## 2019-01-14 LAB — BASIC METABOLIC PANEL
Anion gap: 9 (ref 5–15)
BUN: 9 mg/dL (ref 6–20)
CO2: 22 mmol/L (ref 22–32)
Calcium: 8.5 mg/dL — ABNORMAL LOW (ref 8.9–10.3)
Chloride: 109 mmol/L (ref 98–111)
Creatinine, Ser: 0.95 mg/dL (ref 0.61–1.24)
GFR calc Af Amer: >60 mL/min (ref >60)
GFR calc non Af Amer: >60 mL/min (ref >60)
Glucose, Bld: 92 mg/dL (ref 70–99)
Potassium: 3.6 mmol/L (ref 3.5–5.1)
Sodium: 140 mmol/L (ref 135–145)

## 2019-01-14 LAB — CBC
HCT: 31.6 % — ABNORMAL LOW (ref 39.0–52.0)
Hemoglobin: 10.6 g/dL — ABNORMAL LOW (ref 13.0–17.0)
MCH: 32.8 pg (ref 26.0–34.0)
MCHC: 33.5 g/dL (ref 30.0–36.0)
MCV: 97.8 fL (ref 80.0–100.0)
Platelets: 231 10*3/uL (ref 150–400)
RBC: 3.23 MIL/uL — ABNORMAL LOW (ref 4.22–5.81)
RDW: 11.3 % — ABNORMAL LOW (ref 11.5–15.5)
WBC: 7.2 10*3/uL (ref 4.0–10.5)
nRBC: 0 % (ref 0.0–0.2)

## 2019-01-14 MED ORDER — CEFDINIR 300 MG PO CAPS: 300.0000 mg | ORAL_CAPSULE | Freq: Two times a day (BID) | ORAL | 0 refills | Status: AC

## 2019-01-14 MED ORDER — CEFDINIR 300 MG PO CAPS: 300.0000 mg | ORAL_CAPSULE | Freq: Two times a day (BID) | ORAL | 0 refills | Status: DC

## 2019-01-14 MED ORDER — CEFDINIR 300 MG PO CAPS: 300.0000 mg | ORAL_CAPSULE | Freq: Two times a day (BID) | ORAL | Status: DC

## 2019-01-14 MED ORDER — INFLUENZA VAC SPLIT QUAD 0.5 ML IM SUSY
0.5000 mL | PREFILLED_SYRINGE | INTRAMUSCULAR | Status: AC
  Administered 2019-01-14: 14:00:00 0.5 mL via INTRAMUSCULAR
  Filled 2019-01-14: qty 0.5

## 2019-01-14 NOTE — Progress Notes (Signed)
Pharmacy Antibiotic Note  Antonio Rowland is a 43 y.o. male admitted on 01/11/2019 with sepsis 2/2 PNA and pneumatosis.  Pharmacy has been consulted for Vancomycin dosing.  WBC wnl at 7.2. Remains afebrile. Renal function stable with Scr of 0.95. CT abdomen shows significant progression of L lower lobe PNA. Will plan to check vancomycin peak and trough levels today since at steady state.  Plan: - Vancomycin 750 mg IV Q 8 hrs.  Goal AUC 400-550. Expected AUC: 422 SCr used: 0.95 - Check vancomycin peak and trough levels today - Narrow ceftriaxone to cefdinir per MD - Monitor ABX LOT  Height: 5\' 10"  (177.8 cm) Weight: 170 lb (77.1 kg) IBW/kg (Calculated) : 73  Temp (24hrs), Avg:98.8 F (37.1 C), Min:98.5 F (36.9 C), Max:99.2 F (37.3 C)  Recent Labs  Lab 01/11/19 1926 01/11/19 2020 01/11/19 2251 01/12/19 0000 01/12/19 0805 01/13/19 0324 01/14/19 0255  WBC 14.4*  --   --   --  20.0* 12.2* 7.2  CREATININE 1.10  --   --   --  1.03 0.93 0.95  LATICACIDVEN  --  1.5 0.9 0.8  --   --   --     Estimated Creatinine Clearance: 104.6 mL/min (by C-G formula based on SCr of 0.95 mg/dL).    Allergies  Allergen Reactions  . Azithromycin     hives  . Norco [Hydrocodone-Acetaminophen]     Weakness  . Ritalin [Methylphenidate]     swelling    Antimicrobials this admission: Ceftriaxone 10/7 x 1; 10/9 >> 10/10 Metronidazole 10/7 >> 10/9 Doxy 10/7 x 1 Cefepime 10/7>> 10/9 Cefdinir 10/10 >> Vanc 10/7>>  Microbiology results: 10/7 covid neg 10/7 blood x2: ngtd 10/7 rhinovirus/enterovirus 10/9 MRSA PCR: pos  Richardine Service, PharmD PGY1 Pharmacy Resident Phone: [phone] 01/14/2019  9:39 AM  Please check [email or web address] for unit-specific pharmacy phone numbers.

## 2019-01-14 NOTE — Plan of Care (Signed)

## 2019-01-14 NOTE — Discharge Summary (Signed)
Physician Discharge Summary  Antonio Rowland XBM:[id number] DOB: Jan 27, 1976 DOA: 01/11/2019  PCP: Patient, No Pcp Per  Admit date: 01/11/2019 Discharge date: 01/14/2019  Admitted From: Home Disposition:  home  Recommendations for Outpatient Follow-up:  1. Follow up with PCP in 1-2 weeks   Discharge Condition:improved CODE STATUS:Full Diet recommendation: Regular   Brief/Interim Summary: 43yo who presented to ED with complaints of syncopal episode relating to marked abd discomfort. Abd pain noted to be acute in onset with reports of BRBPR. Initial CT abd with findings consistent with PNA as well as possible pneumatosis. General Surgery was consulted. Hospitalist was consulted for consideration for medical admission.  Discharge Diagnoses:  Active Problems:   Sepsis ([place])   Bright red blood per rectum   CAP (community acquired pneumonia)   Tobacco abuse   Present on Admission: .Sepsis secondary to PNA present on admit -SIRS criteria met with elevated white blood cell count, tachycardia ,fever.  - L sided PNA seen on CXR and again demonstrated on CT abd - CT abd initially with findings worrisome for pneumatosis. F/u CT with contrast noted to have benign abd findings - COVID is neg, rhinovirus positive, urine strep pneumo is pos, MRSA screen pos -Pt has improved on empiric vanc, cefepime, and flagyl -General surgery was initially consulted for concerns of possible pneumatosis, since signed off given benign findings -Lactate of 0.8 -Further improved with rocephin and vancomycin overnight. Will prescribe Omnicef to complete total 7 days of abx on d/c  .CAP(community acquired pneumonia) -L sided PNA noted on imaging, personally reviewed -Pt is rhinovirus positive, urine strep pneumo pos, MRSA screen pos -Narrowed abx coverage per above -Improved  .Tobacco abuse - - cessation done at time of admission - order nicotine patch  - nursing tobacco cessation  protocol. Stable  Abd pain,pneumatosis ruled out -CT abd personally reviewed with findings suggestive of pneumatosis -General Surgery consulted, appreciate input. Follow contrast CT abd confirms benign findings -Surgery has since signed off -Presenting LUQ abd pain likely related to PNA  Syncope -Possible vasovagal given relation to marked abd pain above -Stable thus far -2d echo reviewed, normal LVEF, unremarkable   Discharge Instructions   Allergies as of 01/14/2019      Reactions   Azithromycin    hives   Norco [hydrocodone-acetaminophen]    Weakness   Ritalin [methylphenidate]    swelling      Medication List    TAKE these medications   cefdinir 300 MG capsule Commonly known as: OMNICEF Take 1 capsule (300 mg total) by mouth every 12 (twelve) hours for 4 days.   ibuprofen 200 MG tablet Commonly known as: ADVIL Take 200 mg by mouth every 6 (six) hours as needed for moderate pain.      Follow-up Information    Follow up with your PCP in 1-2 weeks Follow up.          Allergies  Allergen Reactions  . Azithromycin     hives  . Norco [Hydrocodone-Acetaminophen]     Weakness  . Ritalin [Methylphenidate]     swelling    Consultations:  General Surgery  Procedures/Studies: Ct Abdomen Pelvis W Contrast  Result Date: 01/12/2019 CLINICAL DATA:  Acute, diffuse abdominal pain which began in the left abdomen with bowel urgency. This was followed by bright red blood in the stool and then a syncopal episode. The pain is most pronounced in the left lower quadrant of the abdomen currently. COVID-19 sepsis. EXAM: CT ABDOMEN AND PELVIS WITH CONTRAST TECHNIQUE: Multidetector CT  imaging of the abdomen and pelvis was performed using the standard protocol following bolus administration of intravenous contrast. CONTRAST:  50m OMNIPAQUE IOHEXOL 300 MG/ML  SOLN COMPARISON:  Abdomen and pelvis CTA obtained yesterday. FINDINGS: Lower chest: Significant increase in patchy  opacity in the left lower lobe. Interval small amount of linear atelectasis in the right lower lobe. Hepatobiliary: Mildly heterogeneous liver. Vicariously excreted contrast in the gallbladder. Pancreas: Unremarkable. No pancreatic ductal dilatation or surrounding inflammatory changes. Spleen: Normal in size without focal abnormality. Adrenals/Urinary Tract: Adrenal glands are unremarkable. Kidneys are normal, without renal calculi, focal lesion, or hydronephrosis. Bladder is unremarkable. Stomach/Bowel: Stomach is within normal limits. Appendix appears normal. No evidence of bowel wall thickening, distention, or inflammatory changes. No evidence of pneumatosis or diverticulitis. Vascular/Lymphatic: No significant vascular findings are present. No enlarged abdominal or pelvic lymph nodes. Reproductive: Prostate is unremarkable. Other: No abdominal wall hernia or abnormality. No abdominopelvic ascites. Musculoskeletal: Minimal lumbar and lower thoracic spine degenerative changes. IMPRESSION: 1. Significant progression of left lower lobe pneumonia. 2. No abdominopelvic abnormality seen. Electronically Signed   By: SClaudie ReveringM.D.   On: 01/12/2019 18:05   Dg Chest Port 1 View  Result Date: 01/11/2019 CLINICAL DATA:  Fever, left-sided chest pain EXAM: PORTABLE CHEST 1 VIEW COMPARISON:  None. FINDINGS: The heart size and mediastinal contours are within normal limits. Patchy airspace opacity seen at the left lung base. The visualized skeletal structures are unremarkable. IMPRESSION: Patchy airspace opacity seen at the left lung base. The findings in the lungs are nonspecific, but concerning for atypical infection, which includes viral pneumonia. Electronically Signed   By: BPrudencio PairM.D.   On: 01/11/2019 22:08   Dg Abd Portable 1 View  Addendum Date: 01/11/2019   ADDENDUM REPORT: 01/11/2019 21:51 ADDENDUM: Hazy airspace disease at the left lung base consistent with pneumonia. Electronically Signed   By: HKathreen Devoid  On: 01/11/2019 21:51   Result Date: 01/11/2019 CLINICAL DATA:  Evaluate for free air, perforation EXAM: PORTABLE ABDOMEN - 1 VIEW COMPARISON:  None. FINDINGS: The bowel gas pattern is normal. No pneumoperitoneum. No radio-opaque calculi or other significant radiographic abnormality are seen. IMPRESSION: No pneumoperitoneum. Electronically Signed: By: HKathreen DevoidOn: 01/11/2019 21:33   Ct Angio Abd/pel W And/or Wo Contrast  Result Date: 01/11/2019 CLINICAL DATA:  GI bleed, covid sepsis left lower quadrant abdominal pain to . EXAM: CTA ABDOMEN AND PELVIS wITHOUT AND WITH CONTRAST TECHNIQUE: Multidetector CT imaging of the abdomen and pelvis was performed using the standard protocol during bolus administration of intravenous contrast. Multiplanar reconstructed images and MIPs were obtained and reviewed to evaluate the vascular anatomy. CONTRAST:  1046mOMNIPAQUE IOHEXOL 350 MG/ML SOLN COMPARISON:  None. FINDINGS: CTA ABDOMEN AND PELVIS FINDINGS VASCULAR Aorta: Normal caliber aorta without aneurysm, dissection, vasculitis or hemodynamically significant stenosis. There is mild aortic atherosclerosis. Celiac: No aneurysm, dissection or hemodynamically significant stenosis. Normal branching pattern SMA: Widely patent without dissection or stenosis. Renals: Single renal arteries bilaterally. No aneurysm, dissection, stenosis or evidence of fibromuscular dysplasia. IMA: Patent without abnormality. Inflow: No aneurysm, stenosis or dissection. Veins: Normal course and caliber of the major veins. Assessment is otherwise limited by the arterial dominant contrast phase. Review of the MIP images confirms the above findings. Review of the MIP images confirms the above findings. NON-VASCULAR Lower chest: There is patchy airspace opacity seen throughout the left base. Hepatobiliary: No solid liver abnormality is seen. No gallstones, gallbladder wall thickening, or biliary dilatation. Pancreas: Unremarkable. No  pancreatic ductal dilatation or surrounding inflammatory changes. Spleen: Normal in size without significant abnormality. Adrenals/Urinary Tract: Adrenal glands are unremarkable. Kidneys are normal, without renal calculi, solid lesion, or hydronephrosis. Bladder is unremarkable. Stomach/Bowel: The stomach appears to be grossly unremarkable. There small air bubbles seen within small bowel in the left mid abdomen. No definite bowel wall thickening or surrounding free air is seen. Colonic diverticula noted without definite diverticulitis. There is question Lymphatic: No enlarged abdominal or pelvic lymph nodes. Reproductive: No mass or other significant abnormality. Other: No abdominal wall hernia or abnormality. No abdominopelvic ascites. Musculoskeletal: No acute or significant osseous findings. Review of the MIP images confirms the above findings IMPRESSION: VASCULAR 1. No acute vascular abnormality. 2. NON-VASCULAR 1. Patchy airspace opacity seen throughout the left lung base. This could be due to atypical viral infectious etiology. 2. Small air bubbles seen within the left mid abdomen likely within small bowel loops. No definite bowel wall thickening or surrounding inflammatory changes. Not thought to likely represent pneumatosis. However if further evaluation is warranted would recommend repeat CT with oral contrast. 3. Diverticulosis without definite diverticulitis. These results were called by telephone at the time of interpretation on 01/11/2019 at 10:01 pm to provider [facility], PA, who verbally acknowledged these results. Electronically Signed   By: Prudencio Pair M.D.   On: 01/11/2019 22:04     Subjective: Eager to go home  Discharge Exam: Vitals:   01/14/19 0433 01/14/19 0838  BP: (!) 140/93 128/90  Pulse: 82 63  Resp: 19 15  Temp: 98.5 F (36.9 C) 98.9 F (37.2 C)  SpO2: 100% 98%   Vitals:   01/13/19 2104 01/13/19 2344 01/14/19 0433 01/14/19 0838  BP: 129/87 112/75 (!) 140/93 128/90   Pulse: 66 72 82 63  Resp: 18 (!) 22 19 15   Temp: 98.8 F (37.1 C)  98.5 F (36.9 C) 98.9 F (37.2 C)  TempSrc: Oral Oral Oral Oral  SpO2: 97% 98% 100% 98%  Weight:      Height:        General: Pt is alert, awake, not in acute distress Cardiovascular: RRR, S1/S2 +, no rubs, no gallops Respiratory: CTA bilaterally, no wheezing, no rhonchi Abdominal: Soft, NT, ND, bowel sounds + Extremities: no edema, no cyanosis   The results of significant diagnostics from this hospitalization (including imaging, microbiology, ancillary and laboratory) are listed below for reference.     Microbiology: Recent Results (from the past 240 hour(s))  Culture, blood (routine x 2)     Status: None (Preliminary result)   Collection Time: 01/11/19  8:21 PM   Specimen: BLOOD  Result Value Ref Range Status   Specimen Description BLOOD RIGHT ANTECUBITAL  Final   Special Requests   Final    BOTTLES DRAWN AEROBIC AND ANAEROBIC Blood Culture results may not be optimal due to an excessive volume of blood received in culture bottles   Culture   Final    NO GROWTH 2 DAYS Performed at [facility], [place] [address] [place], [place] [address]    Report Status PENDING  Incomplete  Culture, blood (routine x 2)     Status: None (Preliminary result)   Collection Time: 01/11/19  8:30 PM   Specimen: BLOOD  Result Value Ref Range Status   Specimen Description BLOOD LEFT ANTECUBITAL  Final   Special Requests   Final    BOTTLES DRAWN AEROBIC AND ANAEROBIC Blood Culture results may not be optimal due to an excessive volume of blood  received in culture bottles   Culture   Final    NO GROWTH 2 DAYS Performed at [facility], [place] [address], [place], [place] [address]    Report Status PENDING  Incomplete  SARS Coronavirus 2 [facility] order, Performed in [facility] hospital lab) Nasopharyngeal Nasopharyngeal Swab     Status: None   Collection Time: 01/11/19  8:30 PM   Specimen: Nasopharyngeal Swab   Result Value Ref Range Status   SARS Coronavirus 2 NEGATIVE NEGATIVE Final    Comment: (NOTE) If result is NEGATIVE SARS-CoV-2 target nucleic acids are NOT DETECTED. The SARS-CoV-2 RNA is generally detectable in upper and lower  respiratory specimens during the acute phase of infection. The lowest  concentration of SARS-CoV-2 viral copies this assay can detect is 250  copies / mL. A negative result does not preclude SARS-CoV-2 infection  and should not be used as the sole basis for treatment or other  patient management decisions.  A negative result may occur with  improper specimen collection / handling, submission of specimen other  than nasopharyngeal swab, presence of viral mutation(s) within the  areas targeted by this assay, and inadequate number of viral copies  (<250 copies / mL). A negative result must be combined with clinical  observations, patient history, and epidemiological information. If result is POSITIVE SARS-CoV-2 target nucleic acids are DETECTED. The SARS-CoV-2 RNA is generally detectable in upper and lower  respiratory specimens dur ing the acute phase of infection.  Positive  results are indicative of active infection with SARS-CoV-2.  Clinical  correlation with patient history and other diagnostic information is  necessary to determine patient infection status.  Positive results do  not rule out bacterial infection or co-infection with other viruses. If result is PRESUMPTIVE POSTIVE SARS-CoV-2 nucleic acids MAY BE PRESENT.   A presumptive positive result was obtained on the submitted specimen  and confirmed on repeat testing.  While 2019 novel coronavirus  (SARS-CoV-2) nucleic acids may be present in the submitted sample  additional confirmatory testing may be necessary for epidemiological  and / or clinical management purposes  to differentiate between  SARS-CoV-2 and other Sarbecovirus currently known to infect humans.  If clinically indicated additional  testing with an alternate test  methodology [phone]) is advised. The SARS-CoV-2 RNA is generally  detectable in upper and lower respiratory sp ecimens during the acute  phase of infection. The expected result is Negative. Fact Sheet for Patients:  StrictlyIdeas.no Fact Sheet for Healthcare Providers: BankingDealers.co.za This test is not yet approved or cleared by the [place] FDA and has been authorized for detection and/or diagnosis of SARS-CoV-2 by FDA under an Emergency Use Authorization (EUA).  This EUA will remain in effect (meaning this test can be used) for the duration of the COVID-19 declaration under Section 564(b)(1) of the Act, 21 U.S.C. section 360bbb-3(b)(1), unless the authorization is terminated or revoked sooner. Performed at [facility], [place] [address], [place], [place] [address]   Respiratory Panel by PCR     Status: Abnormal   Collection Time: 01/11/19 11:32 PM   Specimen: Nasopharyngeal Swab; Respiratory  Result Value Ref Range Status   Adenovirus NOT DETECTED NOT DETECTED Final   Coronavirus 229E NOT DETECTED NOT DETECTED Final    Comment: (NOTE) The Coronavirus on the Respiratory Panel, DOES NOT test for the novel  Coronavirus (2019 nCoV)    Coronavirus HKU1 NOT DETECTED NOT DETECTED Final   Coronavirus NL63 NOT DETECTED NOT DETECTED Final  Coronavirus OC43 NOT DETECTED NOT DETECTED Final   Metapneumovirus NOT DETECTED NOT DETECTED Final   Rhinovirus / Enterovirus DETECTED (A) NOT DETECTED Final   Influenza A NOT DETECTED NOT DETECTED Final   Influenza B NOT DETECTED NOT DETECTED Final   Parainfluenza Virus 1 NOT DETECTED NOT DETECTED Final   Parainfluenza Virus 2 NOT DETECTED NOT DETECTED Final   Parainfluenza Virus 3 NOT DETECTED NOT DETECTED Final   Parainfluenza Virus 4 NOT DETECTED NOT DETECTED Final   Respiratory Syncytial Virus NOT DETECTED NOT DETECTED Final   Bordetella pertussis NOT  DETECTED NOT DETECTED Final   Chlamydophila pneumoniae NOT DETECTED NOT DETECTED Final   Mycoplasma pneumoniae NOT DETECTED NOT DETECTED Final    Comment: Performed at [facility], [place] [address] [place], [place] [address]  MRSA PCR Screening     Status: Abnormal   Collection Time: 01/13/19  9:15 AM   Specimen: Nasopharyngeal  Result Value Ref Range Status   MRSA by PCR POSITIVE (A) NEGATIVE Final    Comment:        The GeneXpert MRSA Assay (FDA approved for NASAL specimens only), is one component of a comprehensive MRSA colonization surveillance program. It is not intended to diagnose MRSA infection nor to guide or monitor treatment for MRSA infections. RESULT CALLED TO, READ BACK BY AND VERIFIED WITH: B. Person RN 12:15 01/13/19 ([email or web address]) Performed at [facility], [place] [address] [place], [place] [address]      Labs: BNP (last 3 results) No results for input(s): BNP in the last 8760 hours. Basic Metabolic Panel: Recent Labs  Lab 01/11/19 1926 01/12/19 0805 01/13/19 0324 01/14/19 0255  NA 138 140 138 140  K 3.8 3.4* 3.4* 3.6  CL 103 108 109 109  CO2 24 23 20* 22  GLUCOSE 99 92 82 92  BUN 12 10 9 9   CREATININE 1.10 1.03 0.93 0.95  CALCIUM 9.1 8.2* 8.0* 8.5*  MG  --  1.8  --   --   PHOS  --  2.0*  --   --    Liver Function Tests: Recent Labs  Lab 01/11/19 2055 01/12/19 0805 01/13/19 0324  AST 20 16 16   ALT 19 15 17   ALKPHOS 48 39 40  BILITOT 1.4* 1.1 0.9  PROT 7.2 5.8* 5.3*  ALBUMIN 4.2 3.1* 3.0*   Recent Labs  Lab 01/11/19 1926  LIPASE 17   No results for input(s): AMMONIA in the last 168 hours. CBC: Recent Labs  Lab 01/11/19 1926 01/12/19 0805 01/13/19 0324 01/14/19 0255  WBC 14.4* 20.0* 12.2* 7.2  HGB 12.2* 10.9* 10.3* 10.6*  HCT 37.5* 32.4* 31.2* 31.6*  MCV 100.8* 99.7 99.0 97.8  PLT 250 212 217 231   Cardiac Enzymes: Recent Labs  Lab 01/12/19 0243  CKTOTAL 127   BNP: Invalid input(s): POCBNP CBG: No results for  input(s): GLUCAP in the last 168 hours. D-Dimer Recent Labs    01/12/19 0243  DDIMER 0.41   Hgb A1c No results for input(s): HGBA1C in the last 72 hours. Lipid Profile No results for input(s): CHOL, HDL, LDLCALC, TRIG, CHOLHDL, LDLDIRECT in the last 72 hours. Thyroid function studies No results for input(s): TSH, T4TOTAL, T3FREE, THYROIDAB in the last 72 hours.  Invalid input(s): FREET3 Anemia work up No results for input(s): VITAMINB12, FOLATE, FERRITIN, TIBC, IRON, RETICCTPCT in the last 72 hours. Urinalysis    Component Value Date/Time   COLORURINE YELLOW 01/11/2019 2021   APPEARANCEUR CLEAR 01/11/2019 2021  LABSPEC 1.029 01/11/2019 2021   PHURINE 5.0 01/11/2019 2021   GLUCOSEU NEGATIVE 01/11/2019 2021   HGBUR SMALL (A) 01/11/2019 2021   BILIRUBINUR NEGATIVE 01/11/2019 2021   KETONESUR 20 (A) 01/11/2019 2021   PROTEINUR NEGATIVE 01/11/2019 2021   NITRITE NEGATIVE 01/11/2019 2021   LEUKOCYTESUR NEGATIVE 01/11/2019 2021   Sepsis Labs Invalid input(s): PROCALCITONIN,  WBC,  LACTICIDVEN Microbiology Recent Results (from the past 240 hour(s))  Culture, blood (routine x 2)     Status: None (Preliminary result)   Collection Time: 01/11/19  8:21 PM   Specimen: BLOOD  Result Value Ref Range Status   Specimen Description BLOOD RIGHT ANTECUBITAL  Final   Special Requests   Final    BOTTLES DRAWN AEROBIC AND ANAEROBIC Blood Culture results may not be optimal due to an excessive volume of blood received in culture bottles   Culture   Final    NO GROWTH 2 DAYS Performed at [facility], [place] [address], [place], [place] [address]    Report Status PENDING  Incomplete  Culture, blood (routine x 2)     Status: None (Preliminary result)   Collection Time: 01/11/19  8:30 PM   Specimen: BLOOD  Result Value Ref Range Status   Specimen Description BLOOD LEFT ANTECUBITAL  Final   Special Requests   Final    BOTTLES DRAWN AEROBIC AND ANAEROBIC Blood Culture results may not be  optimal due to an excessive volume of blood received in culture bottles   Culture   Final    NO GROWTH 2 DAYS Performed at [facility], [place] [address], [place],  [address]    Report Status PENDING  Incomplete  SARS Coronavirus 2 [facility] order, Performed in [facility] hospital lab) Nasopharyngeal Nasopharyngeal Swab     Status: None   Collection Time: 01/11/19  8:30 PM   Specimen: Nasopharyngeal Swab  Result Value Ref Range Status   SARS Coronavirus 2 NEGATIVE NEGATIVE Final    Comment: (NOTE) If result is NEGATIVE SARS-CoV-2 target nucleic acids are NOT DETECTED. The SARS-CoV-2 RNA is generally detectable in upper and lower  respiratory specimens during the acute phase of infection. The lowest  concentration of SARS-CoV-2 viral copies this assay can detect is 250  copies / mL. A negative result does not preclude SARS-CoV-2 infection  and should not be used as the sole basis for treatment or other  patient management decisions.  A negative result may occur with  improper specimen collection / handling, submission of specimen other  than nasopharyngeal swab, presence of viral mutation(s) within the  areas targeted by this assay, and inadequate number of viral copies  (<250 copies / mL). A negative result must be combined with clinical  observations, patient history, and epidemiological information. If result is POSITIVE SARS-CoV-2 target nucleic acids are DETECTED. The SARS-CoV-2 RNA is generally detectable in upper and lower  respiratory specimens dur ing the acute phase of infection.  Positive  results are indicative of active infection with SARS-CoV-2.  Clinical  correlation with patient history and other diagnostic information is  necessary to determine patient infection status.  Positive results do  not rule out bacterial infection or co-infection with other viruses. If result is PRESUMPTIVE POSTIVE SARS-CoV-2 nucleic acids MAY BE PRESENT.   A presumptive  positive result was obtained on the submitted specimen  and confirmed on repeat testing.  While 2019 novel coronavirus  (SARS-CoV-2) nucleic acids may be present in the submitted sample  additional confirmatory  testing may be necessary for epidemiological  and / or clinical management purposes  to differentiate between  SARS-CoV-2 and other Sarbecovirus currently known to infect humans.  If clinically indicated additional testing with an alternate test  methodology [phone]) is advised. The SARS-CoV-2 RNA is generally  detectable in upper and lower respiratory sp ecimens during the acute  phase of infection. The expected result is Negative. Fact Sheet for Patients:  StrictlyIdeas.no Fact Sheet for Healthcare Providers: BankingDealers.co.za This test is not yet approved or cleared by the [place] FDA and has been authorized for detection and/or diagnosis of SARS-CoV-2 by FDA under an Emergency Use Authorization (EUA).  This EUA will remain in effect (meaning this test can be used) for the duration of the COVID-19 declaration under Section 564(b)(1) of the Act, 21 U.S.C. section 360bbb-3(b)(1), unless the authorization is terminated or revoked sooner. Performed at [facility], [place] [address] [place], [place] [address]   Respiratory Panel by PCR     Status: Abnormal   Collection Time: 01/11/19 11:32 PM   Specimen: Nasopharyngeal Swab; Respiratory  Result Value Ref Range Status   Adenovirus NOT DETECTED NOT DETECTED Final   Coronavirus 229E NOT DETECTED NOT DETECTED Final    Comment: (NOTE) The Coronavirus on the Respiratory Panel, DOES NOT test for the novel  Coronavirus (2019 nCoV)    Coronavirus HKU1 NOT DETECTED NOT DETECTED Final   Coronavirus NL63 NOT DETECTED NOT DETECTED Final   Coronavirus OC43 NOT DETECTED NOT DETECTED Final   Metapneumovirus NOT DETECTED NOT DETECTED Final   Rhinovirus / Enterovirus DETECTED (A)  NOT DETECTED Final   Influenza A NOT DETECTED NOT DETECTED Final   Influenza B NOT DETECTED NOT DETECTED Final   Parainfluenza Virus 1 NOT DETECTED NOT DETECTED Final   Parainfluenza Virus 2 NOT DETECTED NOT DETECTED Final   Parainfluenza Virus 3 NOT DETECTED NOT DETECTED Final   Parainfluenza Virus 4 NOT DETECTED NOT DETECTED Final   Respiratory Syncytial Virus NOT DETECTED NOT DETECTED Final   Bordetella pertussis NOT DETECTED NOT DETECTED Final   Chlamydophila pneumoniae NOT DETECTED NOT DETECTED Final   Mycoplasma pneumoniae NOT DETECTED NOT DETECTED Final    Comment: Performed at [facility], [place]. [address], [place], [place] [address]  MRSA PCR Screening     Status: Abnormal   Collection Time: 01/13/19  9:15 AM   Specimen: Nasopharyngeal  Result Value Ref Range Status   MRSA by PCR POSITIVE (A) NEGATIVE Final    Comment:        The GeneXpert MRSA Assay (FDA approved for NASAL specimens only), is one component of a comprehensive MRSA colonization surveillance program. It is not intended to diagnose MRSA infection nor to guide or monitor treatment for MRSA infections. RESULT CALLED TO, READ BACK BY AND VERIFIED WITH: B. Person RN 12:15 01/13/19 ([email or web address]) Performed at [facility], [place] [address], [place], [place] [address]    Time spent: 30 min  SIGNED:   Marylu Lund, MD  [facility] 01/14/2019, 9:02 AM  If 7PM-7AM, please contact night-coverage

## 2019-01-16 LAB — CULTURE, BLOOD (ROUTINE X 2)
Culture: NO GROWTH
Culture: NO GROWTH

## 2019-01-16 LAB — LEGIONELLA PNEUMOPHILA SEROGP 1 UR AG: L. pneumophila Serogp 1 Ur Ag: NEGATIVE

## 2020-05-01 ENCOUNTER — Emergency Department (HOSPITAL_COMMUNITY): Payer: HRSA Program

## 2020-05-01 ENCOUNTER — Encounter (HOSPITAL_COMMUNITY): Payer: Self-pay | Admitting: Emergency Medicine

## 2020-05-01 ENCOUNTER — Emergency Department (HOSPITAL_COMMUNITY)
Admission: EM | Admit: 2020-05-01 | Discharge: 2020-05-01 | Disposition: A | Discharge status: Home / Self Care | Payer: HRSA Program | Attending: Emergency Medicine | Admitting: Emergency Medicine

## 2020-05-01 DIAGNOSIS — R55 Syncope and collapse: Secondary | ICD-10-CM | POA: Insufficient documentation

## 2020-05-01 DIAGNOSIS — R079 Chest pain, unspecified: Secondary | ICD-10-CM | POA: Diagnosis present

## 2020-05-01 DIAGNOSIS — U071 COVID-19: Secondary | ICD-10-CM | POA: Insufficient documentation

## 2020-05-01 DIAGNOSIS — F17200 Nicotine dependence, unspecified, uncomplicated: Secondary | ICD-10-CM | POA: Insufficient documentation

## 2020-05-01 DIAGNOSIS — R000 Tachycardia, unspecified: Secondary | ICD-10-CM | POA: Diagnosis not present

## 2020-05-01 DIAGNOSIS — R6889 Other general symptoms and signs: Secondary | ICD-10-CM

## 2020-05-01 DIAGNOSIS — B349 Viral infection, unspecified: Secondary | ICD-10-CM | POA: Insufficient documentation

## 2020-05-01 LAB — POC SARS CORONAVIRUS 2 AG -  ED: SARS Coronavirus 2 Ag: NEGATIVE

## 2020-05-01 LAB — URINALYSIS, ROUTINE W REFLEX MICROSCOPIC
Bacteria, UA: NONE SEEN
Bilirubin Urine: NEGATIVE
Glucose, UA: NEGATIVE mg/dL
Ketones, ur: 20 mg/dL — AB
Leukocytes,Ua: NEGATIVE
Nitrite: NEGATIVE
Protein, ur: NEGATIVE mg/dL
Specific Gravity, Urine: 1.016 (ref 1.005–1.030)
pH: 5 (ref 5.0–8.0)

## 2020-05-01 LAB — LACTIC ACID, PLASMA: Lactic Acid, Venous: 0.9 mmol/L (ref 0.5–1.9)

## 2020-05-01 LAB — HEPATIC FUNCTION PANEL
ALT: 12 U/L (ref 0–44)
AST: 16 U/L (ref 15–41)
Albumin: 3.9 g/dL (ref 3.5–5.0)
Alkaline Phosphatase: 42 U/L (ref 38–126)
Bilirubin, Direct: 0.1 mg/dL (ref 0.0–0.2)
Indirect Bilirubin: 1 mg/dL — ABNORMAL HIGH (ref 0.3–0.9)
Total Bilirubin: 1.1 mg/dL (ref 0.3–1.2)
Total Protein: 6.4 g/dL — ABNORMAL LOW (ref 6.5–8.1)

## 2020-05-01 LAB — APTT: aPTT: 31 seconds (ref 24–36)

## 2020-05-01 LAB — CBC
HCT: 36.4 % — ABNORMAL LOW (ref 39.0–52.0)
Hemoglobin: 11.8 g/dL — ABNORMAL LOW (ref 13.0–17.0)
MCH: 32.2 pg (ref 26.0–34.0)
MCHC: 32.4 g/dL (ref 30.0–36.0)
MCV: 99.5 fL (ref 80.0–100.0)
Platelets: 231 10*3/uL (ref 150–400)
RBC: 3.66 MIL/uL — ABNORMAL LOW (ref 4.22–5.81)
RDW: 11.4 % — ABNORMAL LOW (ref 11.5–15.5)
WBC: 6.6 10*3/uL (ref 4.0–10.5)
nRBC: 0 % (ref 0.0–0.2)

## 2020-05-01 LAB — BASIC METABOLIC PANEL
Anion gap: 9 (ref 5–15)
BUN: 13 mg/dL (ref 6–20)
CO2: 24 mmol/L (ref 22–32)
Calcium: 9 mg/dL (ref 8.9–10.3)
Chloride: 101 mmol/L (ref 98–111)
Creatinine, Ser: 1.1 mg/dL (ref 0.61–1.24)
GFR, Estimated: >60 mL/min (ref >60)
Glucose, Bld: 89 mg/dL (ref 70–99)
Potassium: 3.8 mmol/L (ref 3.5–5.1)
Sodium: 134 mmol/L — ABNORMAL LOW (ref 135–145)

## 2020-05-01 LAB — TROPONIN I (HIGH SENSITIVITY)
Troponin I (High Sensitivity): 3 ng/L (ref <18)
Troponin I (High Sensitivity): 3 ng/L (ref <18)

## 2020-05-01 LAB — PROTIME-INR
INR: 1.1 (ref 0.8–1.2)
Prothrombin Time: 13.4 seconds (ref 11.4–15.2)

## 2020-05-01 LAB — D-DIMER, QUANTITATIVE: D-Dimer, Quant: <0.27 ug/mL-FEU (ref 0.00–0.50)

## 2020-05-01 MED ORDER — LACTATED RINGERS IV BOLUS
1000.0000 mL | Freq: Once | INTRAVENOUS | Status: AC
  Administered 2020-05-01: 1000 mL via INTRAVENOUS

## 2020-05-01 MED ORDER — ACETAMINOPHEN 500 MG PO TABS
1000.0000 mg | ORAL_TABLET | Freq: Once | ORAL | Status: AC
  Administered 2020-05-01: 1000 mg via ORAL
  Filled 2020-05-01: qty 2

## 2020-05-01 MED ORDER — KETOROLAC TROMETHAMINE 15 MG/ML IJ SOLN
15.0000 mg | Freq: Once | INTRAMUSCULAR | Status: AC
  Administered 2020-05-01: 15 mg via INTRAVENOUS
  Filled 2020-05-01: qty 1

## 2020-05-01 NOTE — Discharge Instructions (Addendum)
The Covid test is pending at time of discharge.  Instructions on how to follow this up on my chart are on your discharge paperwork, you can also call the department if you are having trouble finding these results.  If he/she is Covid positive he/she will need to be quarantine for total 5 days since the onset of symptoms +24 hours of no fever and resolving symptoms, additionally he/she needs to wear a mask near all others for 5 more days. If he/she is not Covid positive he/she is able to go back to normal day-to-day routine as long as he/she is not having fevers and it has been 24 hours since his/her last fever.  You can take 600 mg of ibuprofen every 6 hours, you can take 1000 mg of Tylenol every 6 hours, you can alternate these every 3 or you can take them together.  You also need to hydrate well over the next few days.  Return to the emergency department with any difficulty breathing or inability to hydrate yourself.

## 2020-05-01 NOTE — ED Provider Notes (Signed)
[facility] EMERGENCY DEPARTMENT Provider Note   CSN: [id number] Arrival date & time: 05/01/20  1335     History Chief Complaint  Patient presents with  . Chest Pain    Antonio Rowland is a 45 y.o. male.   Chest Pain Pain location:  Substernal area Pain quality: pressure and tightness   Pain radiates to:  Does not radiate Pain severity:  Moderate Onset quality:  Gradual Timing:  Constant Progression:  Unchanged Chronicity:  New Context comment:  After a syncopal episode, and now feverish to 103 Relieved by:  Nothing Worsened by:  Nothing Ineffective treatments:  None tried Associated symptoms: fatigue, fever and syncope   Associated symptoms: no back pain, no cough, no headache, no nausea, no palpitations, no shortness of breath, no vomiting and no weakness     HPI: A 45 year old patient presents for evaluation of chest pain. Initial onset of pain was approximately 3-6 hours ago. The patient's chest pain is described as heaviness/pressure/tightness and is not worse with exertion. The patient's chest pain is not middle- or left-sided, is not well-localized, is not sharp and does not radiate to the arms/jaw/neck. The patient does not complain of nausea and denies diaphoresis. The patient has smoked in the past 90 days. The patient has no history of stroke, has no history of peripheral artery disease, denies any history of treated diabetes, has no relevant family history of coronary artery disease (first degree relative at less than age 12), is not hypertensive, has no history of hypercholesterolemia and does not have an elevated BMI (>=30).   History reviewed. No pertinent past medical history.  Patient Active Problem List   Diagnosis Date Noted  . Sepsis (HCC) 01/11/2019  . Bright red blood per rectum 01/11/2019  . CAP (community acquired pneumonia) 01/11/2019  . Tobacco abuse 01/11/2019    History reviewed. No pertinent surgical  history.     Family History  Problem Relation Age of Onset  . Diabetes Other   . Hypertension Other     Social History   Tobacco Use  . Smoking status: Current Every Day Smoker  . Smokeless tobacco: Never Used    Home Medications Prior to Admission medications   Medication Sig Start Date End Date Taking? Authorizing Provider  ibuprofen (ADVIL) 200 MG tablet Take 200 mg by mouth every 6 (six) hours as needed for moderate pain.    [provider]    Allergies    Azithromycin, Norco [hydrocodone-acetaminophen], and Ritalin [methylphenidate]  Review of Systems   Review of Systems  Constitutional: Positive for chills, fatigue and fever.  HENT: Negative for congestion and rhinorrhea.   Respiratory: Negative for cough and shortness of breath.   Cardiovascular: Positive for chest pain and syncope. Negative for palpitations.  Gastrointestinal: Negative for diarrhea, nausea and vomiting.  Genitourinary: Negative for difficulty urinating and dysuria.  Musculoskeletal: Positive for myalgias. Negative for arthralgias and back pain.  Skin: Negative for color change and rash.  Neurological: Positive for syncope. Negative for weakness, light-headedness and headaches.    Physical Exam Updated Vital Signs BP 123/77   Pulse 92   Temp 99.8 F (37.7 C) (Oral)   Resp 13   SpO2 96%   Physical Exam Vitals and nursing note reviewed.  Constitutional:      General: He is not in acute distress.    Appearance: Normal appearance.  HENT:     Head: Normocephalic and atraumatic.     Nose: No rhinorrhea.  Eyes:  General:        Right eye: No discharge.        Left eye: No discharge.     Conjunctiva/sclera: Conjunctivae normal.  Cardiovascular:     Rate and Rhythm: Regular rhythm. Tachycardia present.  Pulmonary:     Effort: Pulmonary effort is normal.     Breath sounds: No stridor. No decreased breath sounds or wheezing.  Chest:     Chest wall: No tenderness.  Abdominal:      General: Abdomen is flat. There is no distension.     Palpations: Abdomen is soft.  Musculoskeletal:        General: No deformity or signs of injury.     Right lower leg: No tenderness.     Left lower leg: No tenderness.  Skin:    General: Skin is warm and dry.  Neurological:     General: No focal deficit present.     Mental Status: He is alert. Mental status is at baseline.     Motor: No weakness.  Psychiatric:        Mood and Affect: Mood normal.        Behavior: Behavior normal.        Thought Content: Thought content normal.     ED Results / Procedures / Treatments   Labs (all labs ordered are listed, but only abnormal results are displayed) Labs Reviewed  BASIC METABOLIC PANEL - Abnormal; Notable for the following components:      Result Value   Sodium 134 (*)    All other components within normal limits  CBC - Abnormal; Notable for the following components:   RBC 3.66 (*)    Hemoglobin 11.8 (*)    HCT 36.4 (*)    RDW 11.4 (*)    All other components within normal limits  URINALYSIS, ROUTINE W REFLEX MICROSCOPIC - Abnormal; Notable for the following components:   Hgb urine dipstick SMALL (*)    Ketones, ur 20 (*)    All other components within normal limits  HEPATIC FUNCTION PANEL - Abnormal; Notable for the following components:   Total Protein 6.4 (*)    Indirect Bilirubin 1.0 (*)    All other components within normal limits  CULTURE, BLOOD (SINGLE)  URINE CULTURE  SARS CORONAVIRUS 2 (TAT 6-24 HRS)  LACTIC ACID, PLASMA  PROTIME-INR  APTT  D-DIMER, QUANTITATIVE (NOT AT [facility])  POC SARS CORONAVIRUS 2 AG -  ED  TROPONIN I (HIGH SENSITIVITY)  TROPONIN I (HIGH SENSITIVITY)    EKG EKG Interpretation  Date/Time:  Wednesday May 01 2020 17:39:03 EST Ventricular Rate:  103 PR Interval:    QRS Duration: 77 QT Interval:  326 QTC Calculation: 427 R Axis:   80 Text Interpretation: Sinus tachycardia Consider right atrial enlargement Borderline T  abnormalities, inferior leads Confirmed by Cherlynn Perches ([id number]) on 05/01/2020 6:49:19 PM   Radiology DG Chest 2 View  Result Date: 05/01/2020 CLINICAL DATA:  Syncopal episode at work, squeezing chest chest pain, smoker EXAM: CHEST - 2 VIEW COMPARISON:  None FINDINGS: Normal heart size, mediastinal contours, and pulmonary vascularity. Lungs clear. No acute infiltrate, pleural effusion, or pneumothorax. Osseous structures unremarkable. IMPRESSION: No acute abnormalities. Electronically Signed   By: Ulyses Southward M.D.   On: 05/01/2020 14:22    Procedures Procedures   Medications Ordered in ED Medications  lactated ringers bolus 1,000 mL (1,000 mLs Intravenous New Bag/Given 05/01/20 1712)  acetaminophen (TYLENOL) tablet 1,000 mg (1,000 mg Oral Given 05/01/20 1710)  ketorolac (  TORADOL) 15 MG/ML injection 15 mg (15 mg Intravenous Given 05/01/20 1932)    ED Course  I have reviewed the triage vital signs and the nursing notes.  Pertinent labs & imaging results that were available during my care of the patient were reviewed by me and considered in my medical decision making (see chart for details).  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  He woke up feeling fatigued chills and with chest tightness.  No sick contacts no Covid vaccine.  Will get EKG Will get screening labs cardiac biomarkers will get screening labs for PE.  We will get other screening labs to include CBC chemistry chest x-ray.  Patient syncope likely related to fever and chills however order set to screen for severe  illnesses in place.  IV fluids given.  Patient is here score is 2.  Troponin is negative x2.  D-dimer is negative.  Chest x-ray reviewed by radiology myself shows no acute cardiopulmonary pathology.  Overall labs are unremarkable to include a negative lactic acid.  Patient likely has flulike symptoms from viral illness.  Screening Covid test is negative but formal  Covid's pending at time of discharge with return precautions provided.  Patient has near resolution of all symptoms after fluid hydration and antipyretics.  Safe for discharge home return precautions discussed.  Fernando Stoiber was evaluated in Emergency Department on 05/01/2020 for the symptoms described in the history of present illness. He was evaluated in the context of the global COVID-19 pandemic, which necessitated consideration that the patient might be at risk for infection with the SARS-CoV-2 virus that causes COVID-19. Institutional protocols and algorithms that pertain to the evaluation of patients at risk for COVID-19 are in a state of rapid change based on information released by regulatory bodies including the CDC and federal and state organizations. These policies and algorithms were followed during the patient's care in the ED.   Final Clinical Impression(s) / ED Diagnoses Final diagnoses:  Chest pain, unspecified type  Flu-like symptoms    Rx / DC Orders ED Discharge Orders    None       Sabino Donovan, MD 05/01/20 2010

## 2020-05-01 NOTE — ED Triage Notes (Addendum)
Patient here after a syncopal event at work. Patient states he suddenly passed out and when he woke up he felt a tight squeeze in his chest. Denies cardiac history.

## 2020-05-02 LAB — SARS CORONAVIRUS 2 (TAT 6-24 HRS): SARS Coronavirus 2: POSITIVE — AB

## 2020-05-03 LAB — URINE CULTURE: Culture: NO GROWTH

## 2020-05-06 LAB — CULTURE, BLOOD (SINGLE): Culture: NO GROWTH

## 2020-07-27 IMAGING — DX DG CHEST 1V PORT
1 series · 1 of 1 positions shown · non-contrast
Comparison: None.

CLINICAL DATA: Fever, left-sided chest pain

EXAM:
PORTABLE CHEST 1 VIEW

[chest ap]
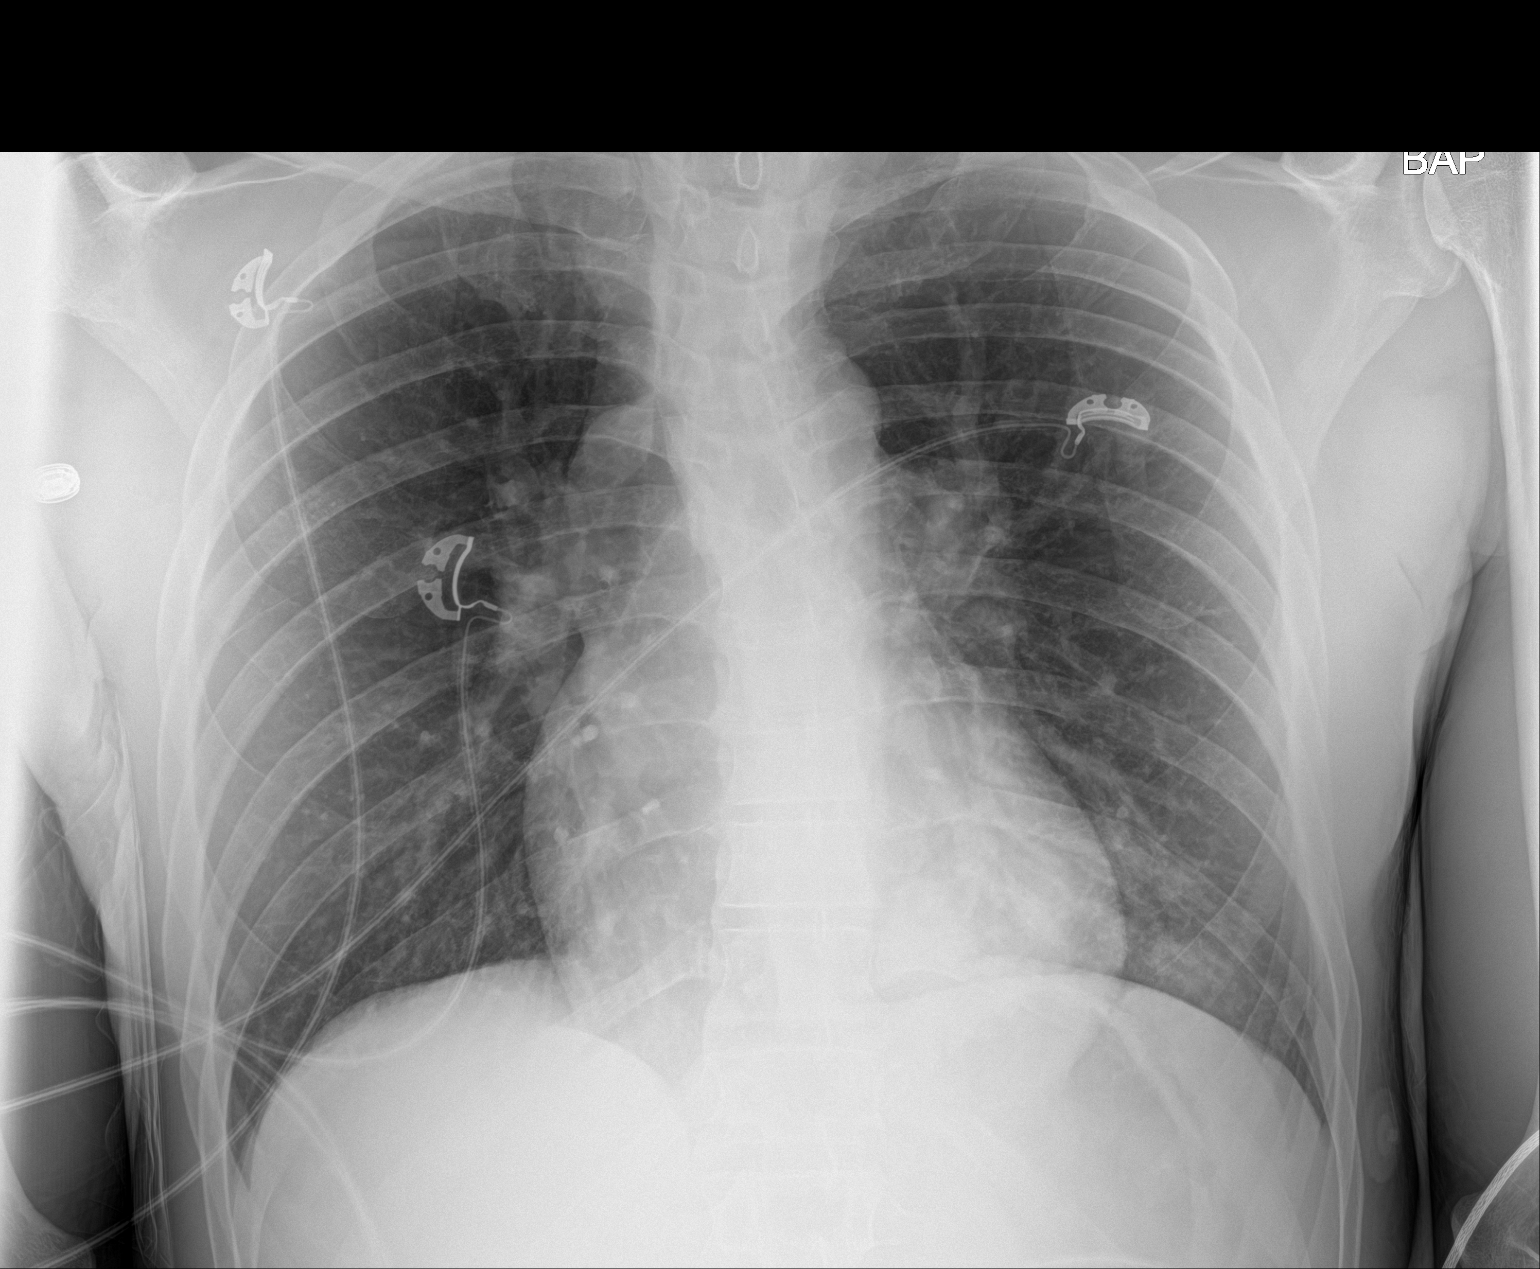

[1 of 1 positions shown; findings below may reference images not displayed]

FINDINGS: The heart size and mediastinal contours are within normal limits.
Patchy airspace opacity seen at the left lung base. The visualized
skeletal structures are unremarkable.
IMPRESSION: Patchy airspace opacity seen at the left lung base. The findings in
the lungs are nonspecific, but concerning for atypical infection,
which includes viral pneumonia.

## 2020-07-27 IMAGING — DX DG ABD PORTABLE 1V
1 series · 1 of 1 positions shown · non-contrast
Comparison: None.
COMPARISON: None.

Addendum:
CLINICAL DATA: Evaluate for free air, perforation

EXAM:
PORTABLE ABDOMEN - 1 VIEW

[abdomen kub]
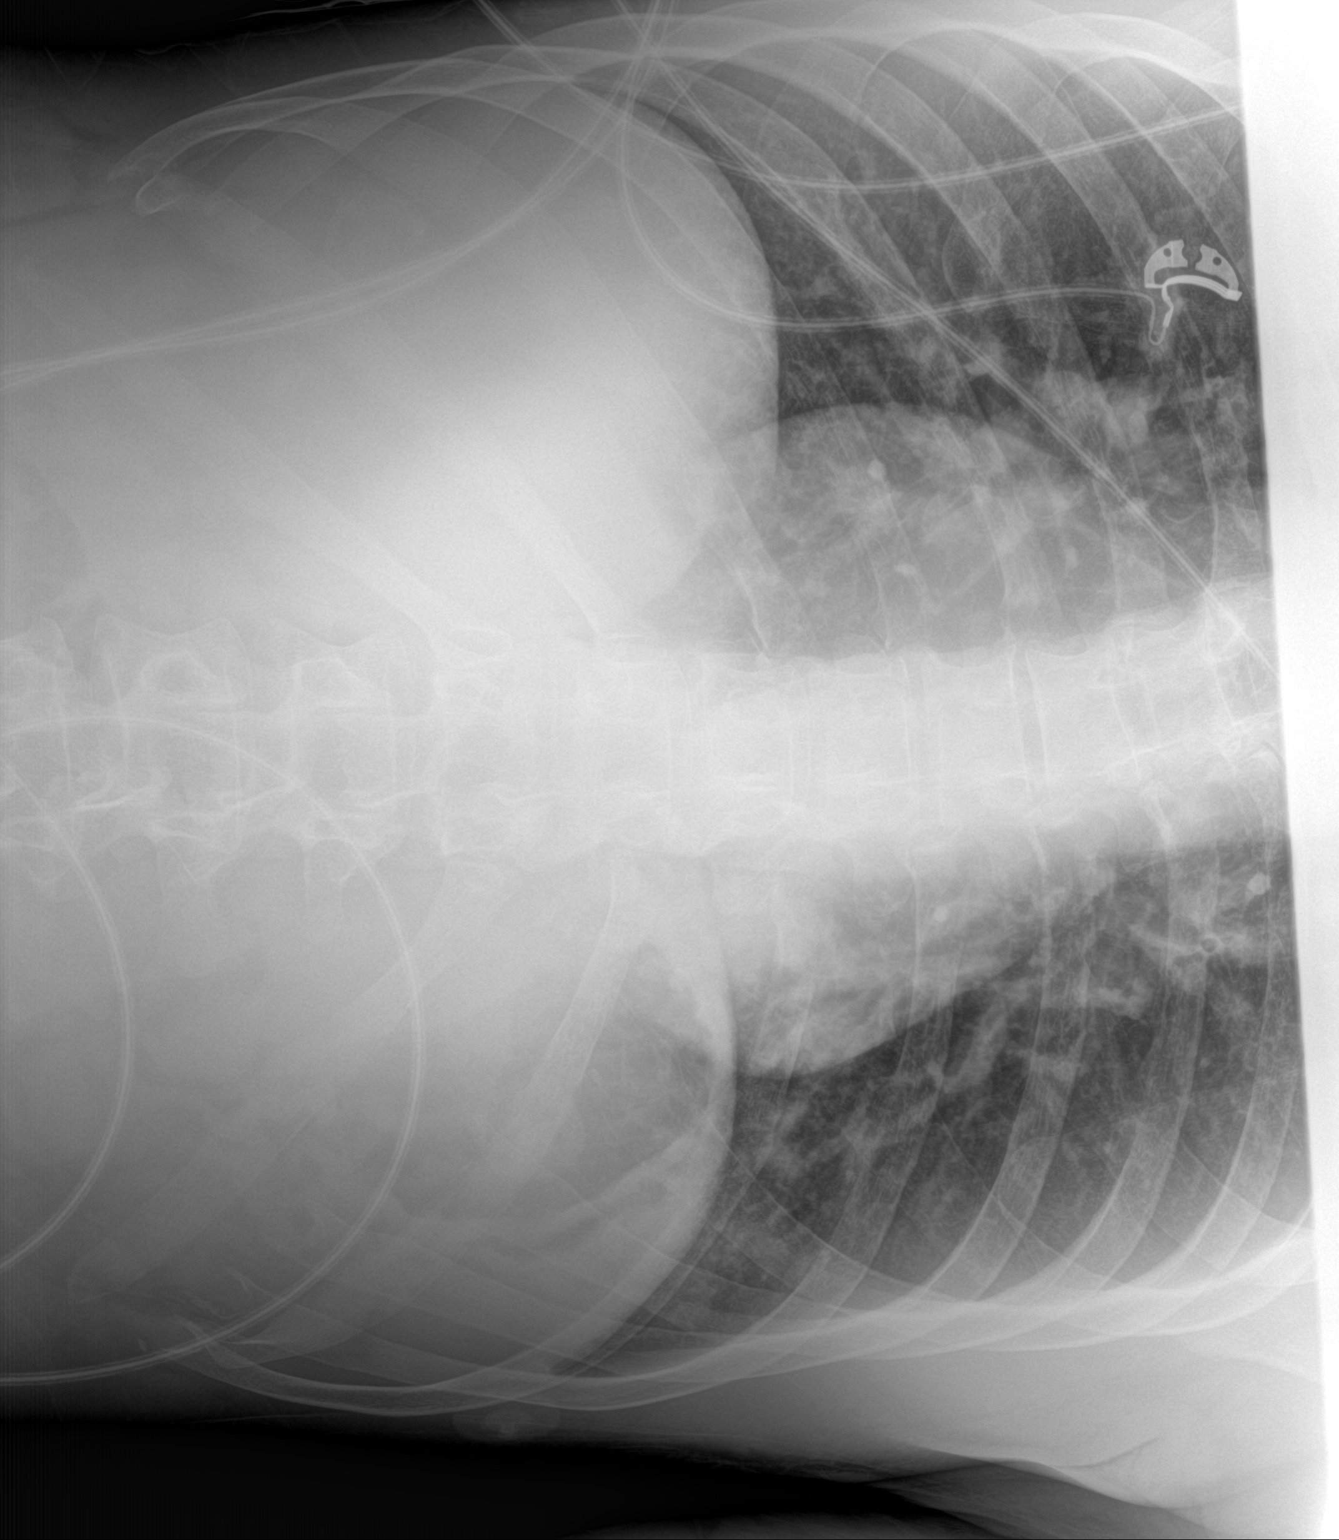

[1 of 1 positions shown; findings below may reference images not displayed]

FINDINGS: The bowel gas pattern is normal. No pneumoperitoneum. No
radio-opaque calculi or other significant radiographic abnormality
are seen.
IMPRESSION: No pneumoperitoneum.

ADDENDUM:
Hazy airspace disease at the left lung base consistent with
pneumonia.

*** End of Addendum ***
FINDINGS: The bowel gas pattern is normal. No pneumoperitoneum. No
radio-opaque calculi or other significant radiographic abnormality
are seen.
IMPRESSION: No pneumoperitoneum.

## 2021-11-15 IMAGING — DX DG CHEST 2V
2 series · 2 of 2 positions shown · non-contrast
Comparison: None

CLINICAL DATA: Syncopal episode at work, squeezing chest chest
pain, smoker

EXAM:
CHEST - 2 VIEW

[x chest ap]
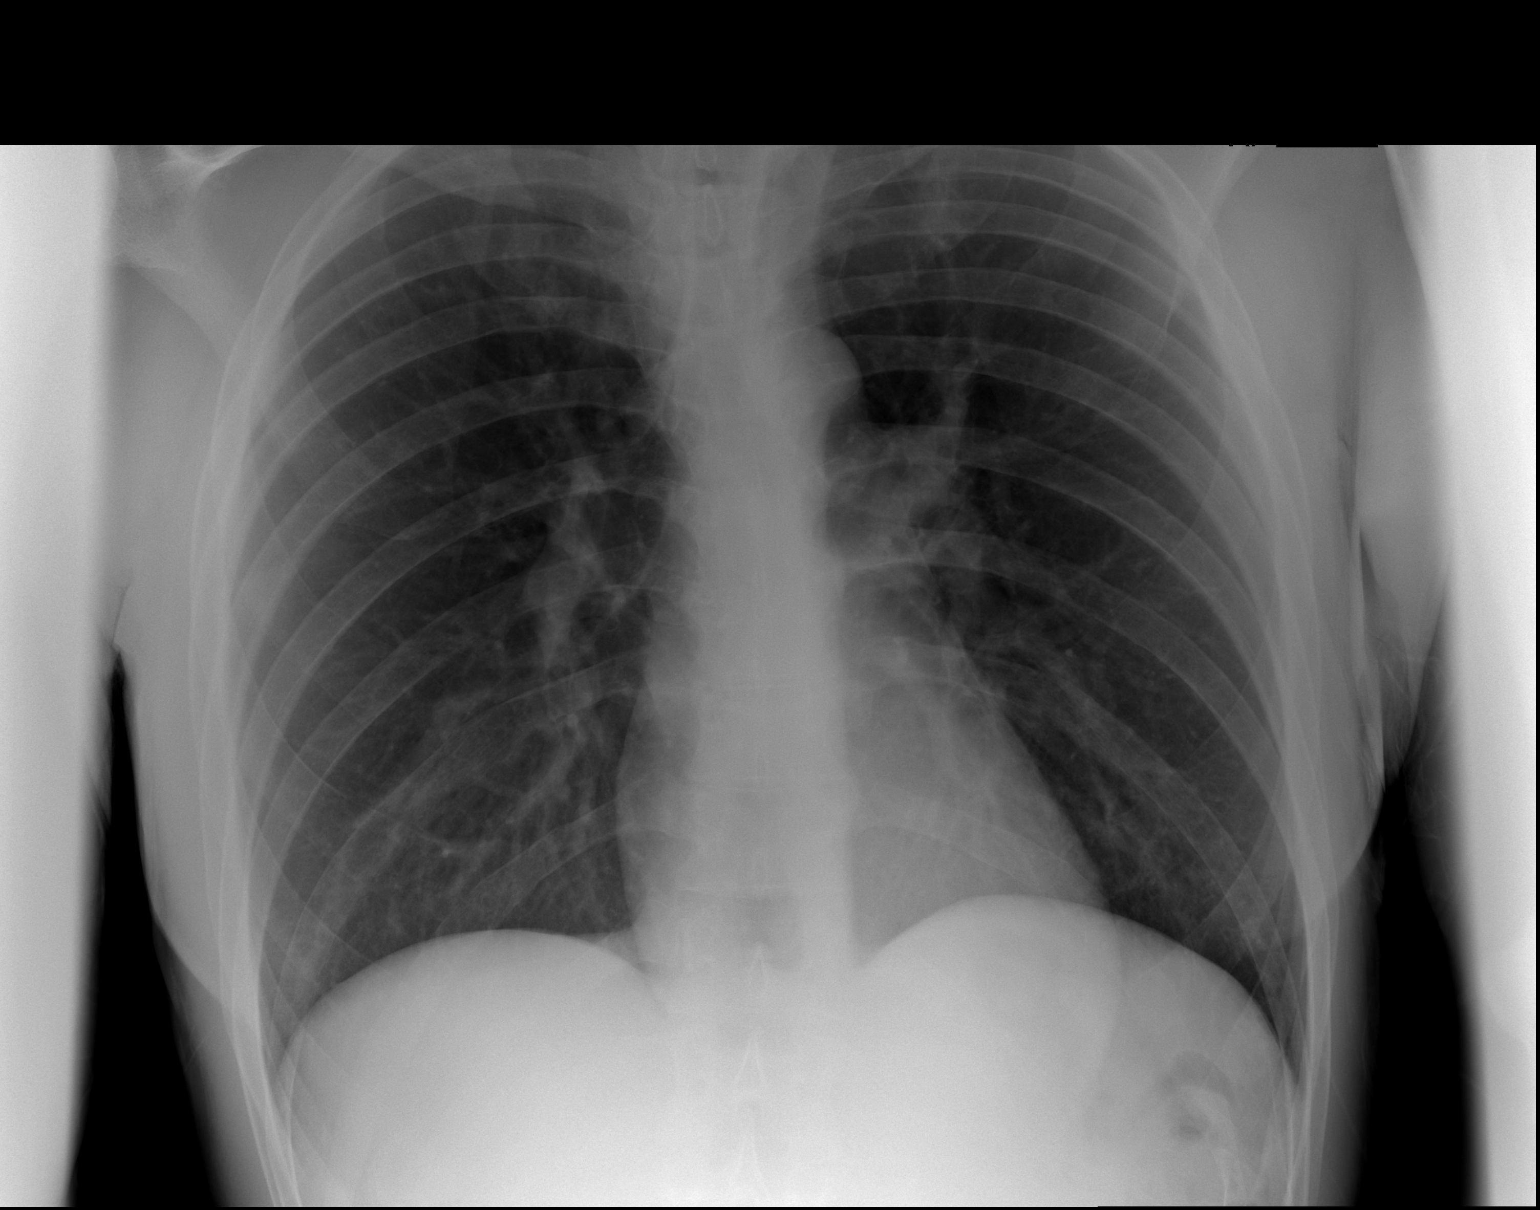

[w chest lat]
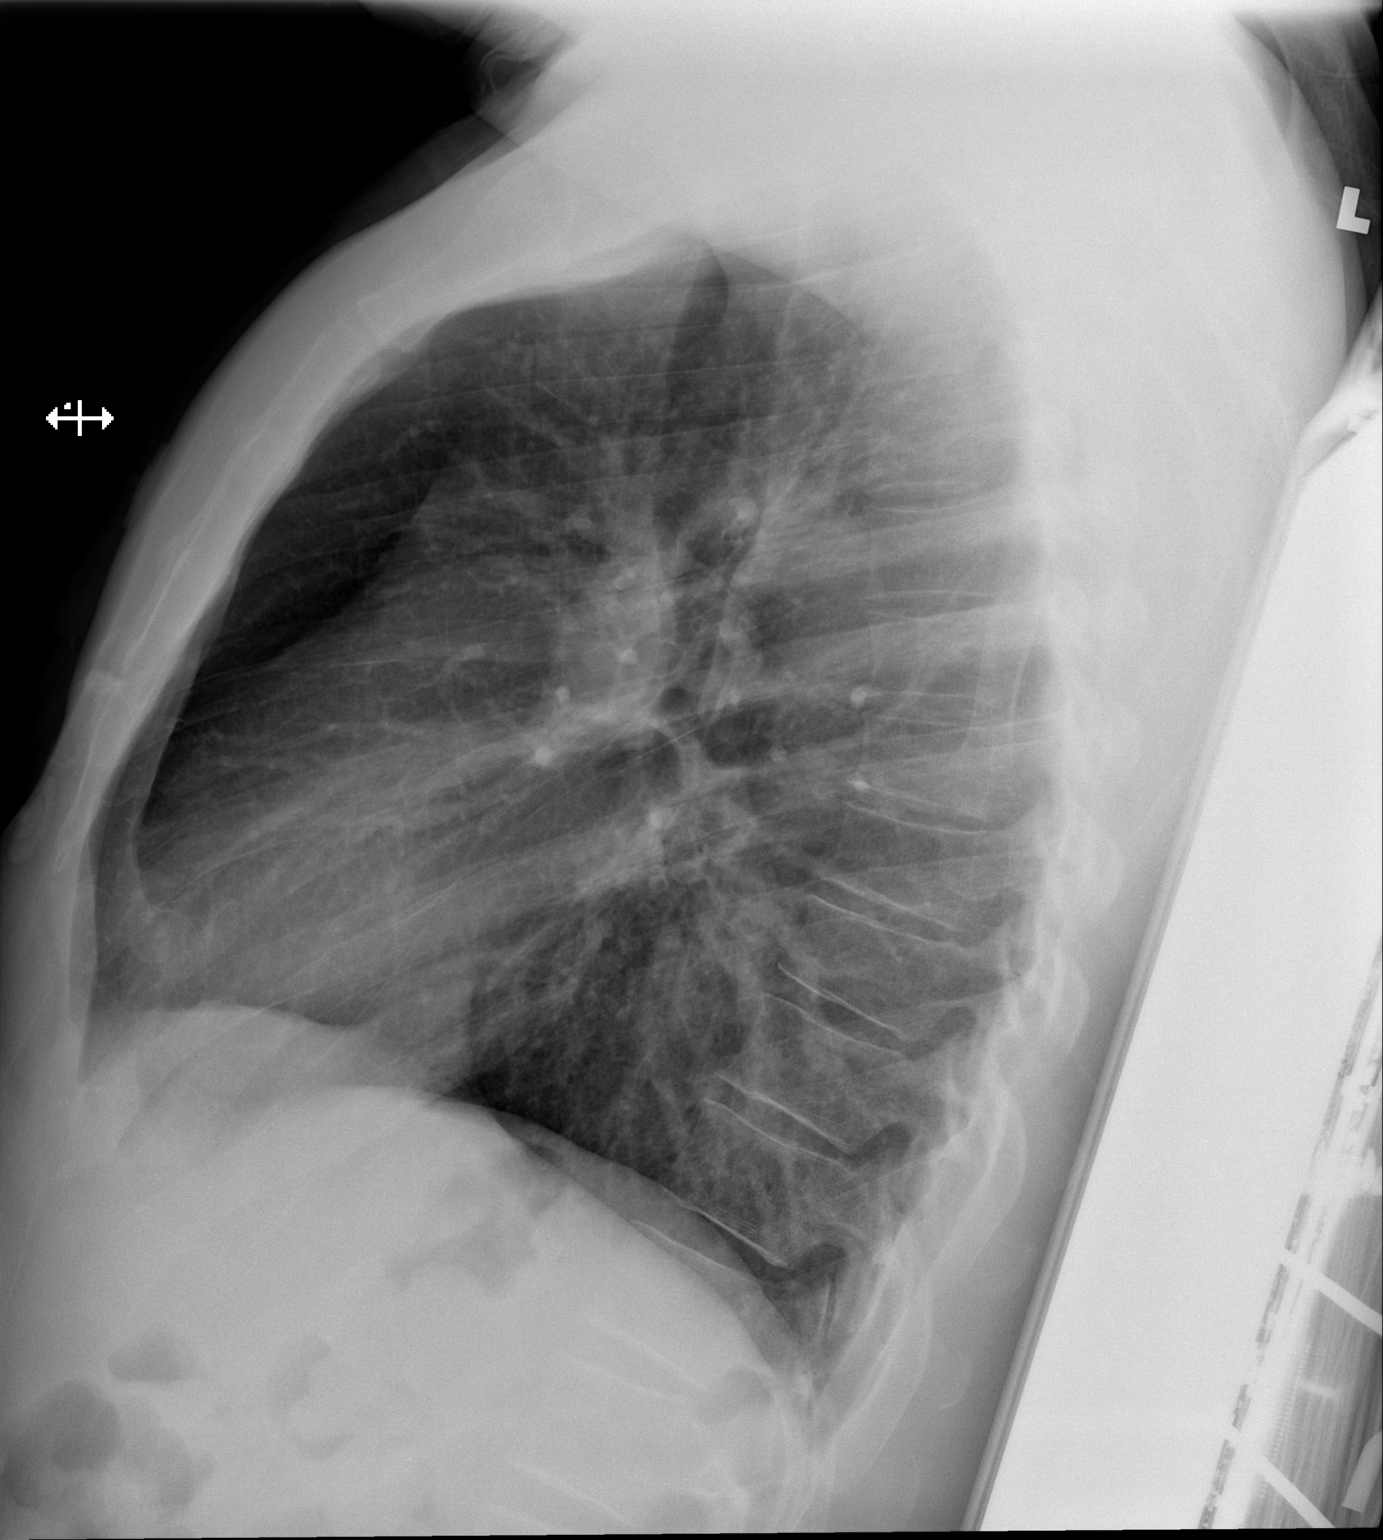

[2 of 2 positions shown; findings below may reference images not displayed]

FINDINGS: Normal heart size, mediastinal contours, and pulmonary vascularity.

Lungs clear.

No acute infiltrate, pleural effusion, or pneumothorax.

Osseous structures unremarkable.
IMPRESSION: No acute abnormalities.
